# Patient Record
Sex: Female | Born: 1965 | Race: White | Hispanic: No | Marital: Married | Smoking: Former smoker
Health system: Southern US, Community
[De-identification: ages and names within clinical notes are randomized; demographics above are authoritative.]

## PROBLEM LIST (undated history)

## (undated) DIAGNOSIS — J302 Other seasonal allergic rhinitis: Secondary | ICD-10-CM

## (undated) DIAGNOSIS — E785 Hyperlipidemia, unspecified: Secondary | ICD-10-CM

## (undated) DIAGNOSIS — H832X9 Labyrinthine dysfunction, unspecified ear: Secondary | ICD-10-CM

## (undated) DIAGNOSIS — J45909 Unspecified asthma, uncomplicated: Secondary | ICD-10-CM

## (undated) HISTORY — DX: Other seasonal allergic rhinitis: J30.2

## (undated) HISTORY — PX: OTHER SURGICAL HISTORY: SHX169

## (undated) HISTORY — PX: TMJ ARTHROPLASTY: SHX1066

## (undated) HISTORY — DX: Unspecified asthma, uncomplicated: J45.909

## (undated) HISTORY — PX: HAND SURGERY: SHX662

## (undated) HISTORY — DX: Hyperlipidemia, unspecified: E78.5

## (undated) HISTORY — DX: Labyrinthine dysfunction, unspecified ear: H83.2X9

## (undated) HISTORY — PX: EXPLORATORY LAPAROTOMY: SUR591

---

## 2007-09-14 DIAGNOSIS — J4520 Mild intermittent asthma, uncomplicated: Secondary | ICD-10-CM

## 2007-09-14 HISTORY — DX: Mild intermittent asthma, uncomplicated: J45.20

## 2011-05-06 DIAGNOSIS — M5481 Occipital neuralgia: Secondary | ICD-10-CM | POA: Insufficient documentation

## 2011-05-06 HISTORY — DX: Occipital neuralgia: M54.81

## 2015-07-25 ENCOUNTER — Other Ambulatory Visit: Payer: Self-pay | Admitting: Family Medicine

## 2015-07-25 ENCOUNTER — Other Ambulatory Visit: Payer: Self-pay

## 2015-07-25 DIAGNOSIS — R42 Dizziness and giddiness: Secondary | ICD-10-CM

## 2015-07-25 DIAGNOSIS — Z1231 Encounter for screening mammogram for malignant neoplasm of breast: Secondary | ICD-10-CM

## 2015-08-01 ENCOUNTER — Ambulatory Visit
Admission: RE | Admit: 2015-08-01 | Discharge: 2015-08-01 | Disposition: A | Discharge status: Home / Self Care | Payer: Managed Care, Other (non HMO) | Source: Ambulatory Visit | Attending: Family Medicine | Admitting: Family Medicine

## 2015-08-01 DIAGNOSIS — R42 Dizziness and giddiness: Secondary | ICD-10-CM

## 2015-08-08 ENCOUNTER — Ambulatory Visit: Payer: Self-pay

## 2015-08-14 ENCOUNTER — Ambulatory Visit
Admission: RE | Admit: 2015-08-14 | Discharge: 2015-08-14 | Disposition: A | Discharge status: Home / Self Care | Payer: Managed Care, Other (non HMO) | Source: Ambulatory Visit

## 2015-08-14 DIAGNOSIS — Z1231 Encounter for screening mammogram for malignant neoplasm of breast: Secondary | ICD-10-CM

## 2015-10-20 ENCOUNTER — Encounter: Payer: Self-pay | Admitting: Neurology

## 2015-10-20 ENCOUNTER — Ambulatory Visit (INDEPENDENT_AMBULATORY_CARE_PROVIDER_SITE_OTHER): Payer: Managed Care, Other (non HMO) | Admitting: Neurology

## 2015-10-20 VITALS — BP 120/70 | HR 104 | Ht 62.0 in | Wt 131.0 lb

## 2015-10-20 DIAGNOSIS — G44219 Episodic tension-type headache, not intractable: Secondary | ICD-10-CM

## 2015-10-20 DIAGNOSIS — H832X3 Labyrinthine dysfunction, bilateral: Secondary | ICD-10-CM

## 2015-10-20 DIAGNOSIS — H832X9 Labyrinthine dysfunction, unspecified ear: Secondary | ICD-10-CM | POA: Insufficient documentation

## 2015-10-20 HISTORY — DX: Labyrinthine dysfunction, unspecified ear: H83.2X9

## 2015-10-20 HISTORY — DX: Episodic tension-type headache, not intractable: G44.219

## 2015-10-20 MED ORDER — NORTRIPTYLINE HCL 75 MG PO CAPS: 75.0000 mg | ORAL_CAPSULE | Freq: Every day | ORAL | 6 refills | Status: DC

## 2015-10-20 NOTE — Progress Notes (Signed)
NEUROLOGY CONSULTATION NOTE  Rhonda Harmon MRN: [id number] DOB: 12-Apr-1965  Referring provider: Dr. Sigmund Hazel Primary care provider: none  Reason for consult:  Arachnoid cyst  Dear Dr Hyacinth Meeker:  Thank you for your kind referral of Rhonda Harmon for consultation of the above symptoms. Although her history is well known to you, please allow me to reiterate it for the purpose of our medical record. Records and images were personally reviewed where available.  HISTORY OF PRESENT ILLNESS: This is a pleasant 50 year old right-handed woman with a history of headaches and vertigo presenting to establish care. Records from her previous neurologist in [place] were reviewed. She has been having dizziness since at least 2009, described as a constant unsteady sensation daily where she feels like she is getting off a cruise ship. No associated hearing loss or tinnitus. MRI brain with and without contrast done 2009 in [place] reported as normal. She had seen vestibular therapy in [place] and was diagnosed with bilateral peripheral vestibular dysfunction. She had significant improvement of symptoms with vestibular therapy. She started having occipital neuralgia in 2013 and was started on Nortriptyline. She reports this helps with both the headaches and dizziness. She states the occipital neuralgia is better, she has pressure-like headaches over the bilateral temporal regions occurring around once a week, no associated nausea, vomiting, photo/phonophobia, visual obscurations. She occasionally wakes up with a headache. She takes prn Excedrin with good effect. She is taking nortriptyline  qhs with no side effects, sleep is good. She denies any diplopia, dysarthria/dysphagia, focal numbness/tingling/weakness. She has some neck pain. No bowel/bladder dysfunction. Memory is good, but she sometimes has a hard time focusing due to her balance issues. She fell 6 months ago while walking her dog. She denies any  history of head injuries. Before she left [place], she reported worsening headaches and dizziness and had an MRI with [facility] doen 5/79/17 without contrast which I personally reviewed, there was an 8 x 15mm increased CSF signal with asymmetric enlargement of the right sylvian fissure superiorly which may represent an arachnoid cyst. There is a 3mm hyperintensity in the right parietal white matter. No acute changes. She presents for evaluation of abnormal MRI scan.  PAST MEDICAL HISTORY: Past Medical History:  Diagnosis Date  . Asthma   . Seasonal allergies   . Vestibular dysfunction     PAST SURGICAL HISTORY: Past Surgical History:  Procedure Laterality Date  . EXPLORATORY LAPAROTOMY    . HAND SURGERY    . TMJ ARTHROPLASTY      MEDICATIONS: No current outpatient prescriptions on file prior to visit.   No current facility-administered medications on file prior to visit.     ALLERGIES: Allergies  Allergen Reactions  . Other     Silk sutures   . Penicillins Rash    FAMILY HISTORY: Family History  Problem Relation Age of Onset  . Crohn's disease Sister   . Crohn's disease Sister     SOCIAL HISTORY: Social History   Social History  . Marital status: Married    Spouse name: N/A  . Number of children: N/A  . Years of education: N/A   Occupational History  . Not on file.   Social History Main Topics  . Smoking status: Former Smoker    Quit date: 10/19/2000  . Smokeless tobacco: Not on file  . Alcohol use No  . Drug use: No  . Sexual activity: NotRocky Rishelr Topics Concern  . Not on  file   Social History Narrative  . No narrative on file    REVIEW OF SYSTEMS: Constitutional: No fevers, chills, or sweats, no generalized fatigue, change in appetite Eyes: No visual changes, double vision, eye pain Ear, nose and throat: No hearing loss, ear pain, nasal congestion, sore throat Cardiovascular: No chest pain, palpitations Respiratory:  No shortness  of breath at rest or with exertion, wheezes GastrointestinaI: No nausea, vomiting, diarrhea, abdominal pain, fecal incontinence Genitourinary:  No dysuria, urinary retention or frequency Musculoskeletal:  No neck pain, back pain Integumentary: No rash, pruritus, skin lesions Neurological: as above Psychiatric: No depression, insomnia, anxiety Endocrine: No palpitations, fatigue, diaphoresis, mood swings, change in appetite, change in weight, increased thirst Hematologic/Lymphatic:  No anemia, purpura, petechiae. Allergic/Immunologic: no itchy/runny eyes, nasal congestion, recent allergic reactions, rashes  PHYSICAL EXAM: Vitals:   10/20/15 0844  BP: 120/70  Pulse: (!) 104   General: No acute distress Head:  Normocephalic/atraumatic Eyes: Fundoscopic exam shows bilateral sharp discs, no vessel changes, exudates, or hemorrhages Neck: supple, no paraspinal tenderness, full range of motion Back: No paraspinal tenderness Heart: regular rate and rhythm Lungs: Clear to auscultation bilaterally. Vascular: No carotid bruits. Skin/Extremities: No rash, no edema Neurological Exam: Mental status: alert and oriented to person, place, and time, no dysarthria or aphasia, Fund of knowledge is appropriate.  Recent and remote memory are intact. 2/3 delayed recall.  Attention and concentration are normal.    Able to name objects and repeat phrases. Cranial nerves: CN I: not tested CN II: pupils equal, round and reactive to light, visual fields intact, fundi unremarkable. CN III, IV, VI:  full range of motion, no nystagmus, no ptosis CN V: facial sensation intact CN VII: upper and lower face symmetric CN VIII: hearing intact to finger rub CN IX, X: gag intact, uvula midline CN XI: sternocleidomastoid and trapezius muscles intact CN XII: tongue midline Bulk & Tone: normal, no fasciculations. Motor: 5/5 throughout with no pronator drift. Sensation: intact to light touch, cold, pin, vibration and  joint position sense.  No extinction to double simultaneous stimulation.  Romberg test negative Deep Tendon Reflexes: +2 throughout, no ankle clonus Plantar responses: downgoing bilaterally Cerebellar: no incoordination on finger to nose, heel to shin. No dysdiadochokinesia Gait: narrow-based and steady, mild difficulty with tandem walk but able Tremor: none  IMPRESSION: This is a pleasant 50 year old right-handed woman with a history of occipital neuralgia and bilateral peripheral vestibular dysfunction with chronic sensation of dysequilibrium. She had an MRI brain scan done 08/01/15 which showed an 8x62mm arachnoid cyst. There was concern this may be associated with her headaches, however her description of current headaches are more suggestive of tension-type headaches. No further occipital component. We discussed arachnoid cysts and these typically being incidental findings. I do not see any other signs or symptoms that she is symptomatic from this. Would repeat interval MRI brain with and without contrast in a year, unless symptoms change. We discussed headaches, she will increase nortriptyline to 75mg  qhs. Side effects were discussed. With worsening dizziness, she will be referred for Vestibular therapy, which had significantly helped in the past. She will keep a headache diary and follow-up in 3 months.   Thank you for allowing me to participate in the care of this patient. Please do not hesitate to call for any questions or concerns.   Patrcia Dolly, M.D.  CC: Dr. Hyacinth Meeker

## 2015-10-20 NOTE — Progress Notes (Signed)
Note sent to Sigmund Hazel.

## 2015-10-20 NOTE — Patient Instructions (Addendum)
1. Increase nortriptyline to 75mg  at bedtime 2. Refer to Vestibular Therapy. You have been referred to Neuro Rehab. They will call you directly to schedule an appointment.  Please call [phone] if you do not hear from them.  3. Keep a calendar of your headaches 4. Follow-up in 3 months, call for any changes

## 2015-11-10 ENCOUNTER — Ambulatory Visit: Payer: Managed Care, Other (non HMO) | Attending: Neurology | Admitting: Physical Therapy

## 2015-11-10 ENCOUNTER — Encounter: Payer: Self-pay | Admitting: Physical Therapy

## 2015-11-10 DIAGNOSIS — R2689 Other abnormalities of gait and mobility: Secondary | ICD-10-CM | POA: Insufficient documentation

## 2015-11-10 DIAGNOSIS — R2681 Unsteadiness on feet: Secondary | ICD-10-CM | POA: Insufficient documentation

## 2015-11-10 NOTE — Patient Instructions (Signed)
  Gaze Stabilization: Tip Card 1.Target must remain in focus, not blurry, and appear stationary while head is in motion. 2.Perform exercises with small head movements (45 to either side of midline). 3.Increase speed of head motion so long as target is in focus. 4.If you wear eyeglasses, be sure you can see target through lens (therapist will give specific instructions for bifocal / progressive lenses). 5.These exercises may provoke dizziness or nausea. Work through these symptoms. If too dizzy, slow head movement slightly. Rest between each exercise. 6.Exercises demand concentration; avoid distractions. 7.For safety, perform standing exercises close to a counter, wall, corner, or next to someone.  Copyright  VHI. All rights reserved.  Gaze Stabilization: Standing Feet Apart   Feet shoulder width apart, keeping eyes on target on wall 3 feet away, tilt head down slightly and move head side to side for 30 seconds. Repeat while moving head up and down for 30 seconds. Do 2 sessions per day.   Copyright  VHI. All rights reserved.    

## 2015-11-10 NOTE — Therapy (Signed)
[facility] [address] [place], [place], [address] Phone: [phone]   Fax:  [phone]  Physical Therapy Evaluation  Patient Details  Name: Rhonda Harmon MRN: [id number] Date of Birth: Aug 05, 1965 Referring Provider: Patrcia DollyKaren Aquino, MD  Encounter Date: 11/10/2015      PT End of Session - 11/10/15 1303    Visit Number 1   Number of Visits 9  eval + 8 visits   Date for PT Re-Evaluation 12/25/15   Authorization Type [employer]   PT Start Time 1148   PT Stop Time 1238   PT Time Calculation (min) 50 min   Equipment Utilized During Treatment Other (comment)  Balance Master   Activity Tolerance Patient tolerated treatment well   Behavior During Therapy [facility] for tasks assessed/performed      Past Medical History:  Diagnosis Date  . Asthma   . Seasonal allergies   . Vestibular dysfunction     Past Surgical History:  Procedure Laterality Date  . EXPLORATORY LAPAROTOMY    . HAND SURGERY    . TMJ ARTHROPLASTY      There were no vitals filed for this visit.       Subjective Assessment - 11/10/15 1152    Subjective "I think I need a refresher on restabilizing myself. The PT in [place], where I lived. I feel like I fidget with my feet when I'm sitting down.Marland Kitchen.Marland Kitchen.I think I'm fidging because I'm off-balance." Per pt, prior PT noted vestibular dysequilibirum and treatment was very effective. Pt worked with vestibular PT in [place] 4 different times. Pt denies room-spinning dizziness. Pt sustains intermittent falls (once in the past 6 months), usually when descending stairs, sidewalk, or declined surface. Increased imbalance when walking over unlevel surfaces.   Pertinent History PMH significant for: arachnoid cyst, exploratory laparotomy, TMJ arthroplasty, asthma, h/o vestibular dysequilibirum (bilateral, per chart)   Patient Stated Goals "Just settling back down, to stop fidgeting to find that level ground."   Currently in  Pain? Yes   Pain Score 5    Pain Location Other (Comment)  B temporal   Pain Orientation Right;Left   Pain Descriptors / Indicators Headache   Pain Type Chronic pain   Pain Onset More than a month ago   Pain Frequency Intermittent   Aggravating Factors  dizziness, increased visual stimulation   Pain Relieving Factors nortriptyline   Multiple Pain Sites No            [facility] PT Assessment - 11/10/15 0001      Assessment   Medical Diagnosis Episodic tension-type headache, not intractable; Vestibular disequilibrium, bilateral   Referring Provider Patrcia DollyKaren Aquino, MD   Onset Date/Surgical Date 03/25/05   Prior Therapy Has had vestibular PT in past when pt lived in [place]     Precautions   Precautions Fall     Restrictions   Weight Bearing Restrictions No     Balance Screen   Has the patient fallen in the past 6 months Yes   How many times? 1   Has the patient had a decrease in activity level because of a fear of falling?  No   Is the patient reluctant to leave their home because of a fear of falling?  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     Cognition   Overall Cognitive Status Within Functional Limits for tasks assessed            Vestibular Assessment - 11/10/15 0001      Symptom Behavior   Type of Dizziness Imbalance   Frequency of Dizziness daily; constant   Duration of Dizziness varies; until whatever is provoking the dizziness stops   Aggravating Factors Turning body quickly;Turning head quickly;Comment  increased  visual stimulation   Relieving Factors Avoiding busy/distracting areas     Occulomotor Exam   Occulomotor Alignment Normal   Spontaneous Absent   Gaze-induced Absent   Smooth Pursuits Intact   Saccades Comment  hypometric inferior to midline   Comment L Head Thrust Test (+) but asymptomatic     Vestibulo-Occular Reflex   VOR 1 Head Only (x 1 viewing) Impaired; visual target blurring with horizontal head movement   Comment Convergence appears WNL.     Visual Acuity   Static Line 7   Dynamic Line 4  Abnormal     Positional Testing   Dix-Hallpike --  BPPV not assessed due to history not suggestive     Pension scheme managerBalancemaster   Balancemaster Sensory organization test   Balancemaster Comment Composite score = 73% compared to age/height normative value 69%. Findings suggest pt ability to use vestibular, somatosensory, and visual input for balance are WNL for age/height. Noted that 2 trials during Condition 5 were lower than average immediately after pt turned head horizontally to speak with this PT. COG alignment:  midline with posterior preference.                [facility] Adult PT Treatment/Exercise - 11/10/15 0001      Ambulation/Gait   Ambulation/Gait Yes   Ambulation/Gait Assistance 6: Modified independent (Device/Increase time);5: Supervision   Ambulation/Gait Assistance Details Mod I for linear gait over level, indoor surfaces. (S) for gait with horizontal head turns.   Ambulation Distance (Feet) 200 Feet   Assistive device None   Gait Pattern Step-through pattern;Narrow base of support   Ambulation Surface Level;Indoor         Vestibular Treatment/Exercise - 11/10/15 0001      Vestibular Treatment/Exercise   Gaze Exercises X1 Viewing Horizontal;X1 Viewing Vertical     X1 Viewing Horizontal   Foot Position standing; seated   Reps 2   Comments 2 x30 seconds. Initial trial in standing; however, pt demonstrated significant difficulty coordinating horizontal head turns  without turning body. Pt had decreased difficulty during subsequent trial in seated     X1 Viewing Vertical   Foot Position seated   Reps 1   Comments x30 seconds; cueing for technique               PT Education - 11/10/15 1250    Education provided Yes   Education Details PT evaluation findings, goals, and POC. SOT findings and implications. HEP initiated for x1 viewing.   Person(s) Educated Patient   Methods Explanation;Verbal cues;Demonstration;Handout   Comprehension Verbalized understanding;Returned demonstration          PT Short Term Goals - 11/10/15 1310      PT SHORT TERM GOAL #1   Title STG"s = LTG's  PT Long Term Goals - 11/10/15 1310      PT LONG TERM GOAL #1   Title Pt will independently perform vestibular HEP to maximize functional gains made in PT.  (Target: 12/08/15)     PT LONG TERM GOAL #2   Title Pt will decrease DHI score from 20 to < / = 2 to indicate significant decrease in pt-perceived disability due to dizziness.  (12/08/15)     PT LONG TERM GOAL #3   Title Complete FGA and write appropriate goal to assess/address dynamic gait stability.  (12/08/15)     PT LONG TERM GOAL #4   Title Dynamic visual acuity will be WNL to indicate improved VOR.  (12/08/15)     PT LONG TERM GOAL #5   Title Pt will independently ambulate >1,000' over unlevel paved and grass surfaces with no overt LOB to indicate improved stability with community mobility. (12/08/15)     Additional Long Term Goals   Additional Long Term Goals Yes     PT LONG TERM GOAL #6   Title Pt will independently negotiate 28 stairs with rails with no overt LOB to indicate increased safety accessing second-floor apartment.  (12/08/15)               Plan - 11/10/15 1305    Clinical Impression Statement Pt is a 50 y/o F referred to vestibular PT to address imbalance. PMH significant for: arachnoid cyst, exploratory laparotomy, TMJ arthroplasty, asthma, h/o vestibular  dysequilibirum (bilateral, per chart). PT evaluation reveals the following: impaired VOR, (+) L Head Thrust Test, abnormal dynamic visual acuity; and decreased gait stability with horizontal head turns. SOT findings suggest that pt ability to utilize multi-sensory input for balance is WNL for age/height. Pt will benefit from vestibular PT 2x/week for up to 4 weeks to address said impairments.    Rehab Potential Good   Clinical Impairments Affecting Rehab Potential Pt reports success with vestibular PT in past   PT Frequency 2x / week   PT Duration 4 weeks   PT Treatment/Interventions Gait training;ADLs/Self Care Home Management;Canalith Repostioning;Vestibular;Neuromuscular re-education;Balance training;Therapeutic exercise;Cognitive remediation;Manual techniques;Patient/family education;Functional mobility training;Stair training;Therapeutic activities   PT Next Visit Plan Assess FGA and write appropriate goal. Assess pt stability over unlevel surfaces and with descending ramps, stairs (as falls usually occur when going down stairs). Expand on HEP.   Consulted and Agree with Plan of Care Patient      Patient will benefit from skilled therapeutic intervention in order to improve the following deficits and impairments:  Abnormal gait, Dizziness, Decreased balance  Visit Diagnosis: Other abnormalities of gait and mobility - Plan: PT plan of care cert/re-cert  Unsteadiness on feet - Plan: PT plan of care cert/re-cert     Problem List Patient Active Problem List   Diagnosis Date Noted  . Episodic tension-type headache, not intractable 10/20/2015  . Vestibular disequilibrium 10/20/2015    Jorje Guild, PT, DPT [facility] [address] [place], [place], [address] Phone: [phone]   Fax:  [phone] 11/10/15, 6:11 PM  Name: Rhonda Harmon MRN: [id number] Date of Birth: 1966-03-01

## 2015-11-13 ENCOUNTER — Ambulatory Visit: Payer: Managed Care, Other (non HMO) | Admitting: Rehabilitative and Restorative Service Providers"

## 2015-11-13 DIAGNOSIS — R2681 Unsteadiness on feet: Secondary | ICD-10-CM

## 2015-11-13 DIAGNOSIS — R2689 Other abnormalities of gait and mobility: Secondary | ICD-10-CM

## 2015-11-13 NOTE — Therapy (Signed)
[facility] [address] [place], [place], [address] Phone: [phone]   Fax:  [phone]  Physical Therapy Treatment  Patient Details  Name: Rhonda Harmon MRN: [id number] Date of Birth: 1966-03-05 Referring Provider: Ellouise Newer, MD  Encounter Date: 11/13/2015      PT End of Session - 11/13/15 1942    Visit Number 2   Number of Visits 9  eval + 8 visits   Date for PT Re-Evaluation 12/25/15   Authorization Type [employer]   PT Start Time 1020   PT Stop Time 1100   PT Time Calculation (min) 40 min   Activity Tolerance Patient tolerated treatment well   Behavior During Therapy [facility] for tasks assessed/performed      Past Medical History:  Diagnosis Date  . Asthma   . Seasonal allergies   . Vestibular dysfunction     Past Surgical History:  Procedure Laterality Date  . EXPLORATORY LAPAROTOMY    . HAND SURGERY    . TMJ ARTHROPLASTY      There were no vitals filed for this visit.      Subjective Assessment - 11/13/15 1022    Subjective The patient feels that her most challenging activity is scanning between 3 different computer screens and having to look to right for door and left for window.   Currently in Pain? No/denies  no Headache present today.            [facility] PT Assessment - 11/13/15 1024      Ambulation/Gait   Stairs Yes   Stairs Assistance 7: Independent   Stair Management Technique No rails;Alternating pattern   Number of Stairs 8     Functional Gait  Assessment   Gait assessed  Yes   Gait Level Surface Walks 20 ft in less than 5.5 sec, no assistive devices, good speed, no evidence for imbalance, normal gait pattern, deviates no more than 6 in outside of the 12 in walkway width.   Change in Gait Speed Able to smoothly change walking speed without loss of balance or gait deviation. Deviate no more than 6 in outside of the 12 in walkway width.   Gait with Horizontal Head Turns  Performs head turns smoothly with slight change in gait velocity (eg, minor disruption to smooth gait path), deviates 6-10 in outside 12 in walkway width, or uses an assistive device.   Gait with Vertical Head Turns Performs head turns with no change in gait. Deviates no more than 6 in outside 12 in walkway width.   Gait and Pivot Turn Pivot turns safely within 3 sec and stops quickly with no loss of balance.   Step Over Obstacle Is able to step over one shoe box (4.5 in total height) but must slow down and adjust steps to clear box safely. May require verbal cueing.  adjusts steps to negotiate   Gait with Narrow Base of Support Ambulates 4-7 steps.   Gait with Eyes Closed Walks 20 ft, uses assistive device, slower speed, mild gait deviations, deviates 6-10 in outside 12 in walkway width. Ambulates 20 ft in less than 9 sec but greater than 7 sec.   Ambulating Backwards Walks 20 ft, no assistive devices, good speed, no evidence for imbalance, normal gait   Steps Alternating feet, no rail.   Total Score 24   FGA comment: 24/30             Vestibular Assessment - 11/13/15 1031  Vestibular Assessment   General Observation Patient reports that she feels a sensation of leaving a cruise ship/ a rocking sensation that she notes is worse when sitting at her desk.  She also reports ankle fatigue from catching herself throughout the day (even when seated).  She wears high heels often and feels her balance is improved in heels vs flats.                  [facility] PT Treatment/Exercise - 11/13/15 1100      Neuro Re-ed    Neuro Re-ed Details  Standing on a pillow with feet apart> feet together to narrow base of support, performing eyes closed; marching with eyes closed.  Dynamic standing activities including walking with head motion and turns during gait.           Vestibular Treatment/Exercise - 11/13/15 1100      Vestibular Treatment/Exercise   Vestibular Treatment Provided  Habituation;Gaze   Habituation Exercises Seated Horizontal Head Turns;Standing Horizontal Head Turns   Gaze Exercises X1 Viewing Horizontal;X1 Viewing Vertical     Seated Horizontal Head Turns   Number of Reps  10   Symptom Description  seated on stationery physioball      Standing Horizontal Head Turns   Number of Reps  10   Symptom Description  on compliant surface with feet together for narrow base of support.     X1 Viewing Horizontal   Foot Position standing feet apart   Comments performed x 30 seconds with cues to maintain shoulders stationery and maintain equal ROM to R And L sides.      X1 Viewing Vertical   Foot Position standing feet apart   Comments performed x 30 seconds.               PT Education - 11/13/15 1941    Education provided Yes   Education Details HEP: gaze x 1 viewing reviewed, corner balance (feet apart + eyes closed and feet together head motion), tandem gait, horizontal head turns in sitting on physioball   Person(s) Educated Patient   Methods Explanation;Demonstration;Handout   Comprehension Verbalized understanding;Returned demonstration          PT Short Term Goals - 11/10/15 1310      PT SHORT TERM GOAL #1   Title STG"s = LTG's           PT Long Term Goals - 11/13/15 1943      PT LONG TERM GOAL #1   Title Pt will independently perform vestibular HEP to maximize functional gains made in PT.  (Target: 12/08/15)   Status On-going     PT LONG TERM GOAL #2   Title Pt will decrease DHI score from 20 to < / = 2 to indicate significant decrease in pt-perceived disability due to dizziness.  (12/08/15)   Status On-going     PT LONG TERM GOAL #3   Title Complete FGA and write appropriate goal to assess/address dynamic gait stability.  (12/08/15)   Baseline Met on 11/13/2015- see updated goal below.   Status Achieved     PT LONG TERM GOAL #4   Title Dynamic visual acuity will be WNL to indicate improved VOR.  (12/08/15)   Status  On-going     PT LONG TERM GOAL #5   Title Pt will independently ambulate >1,000' over unlevel paved and grass surfaces with no overt LOB to indicate improved stability with community mobility. (12/08/15)   Status On-going  Additional Long Term Goals   Additional Long Term Goals Yes     PT LONG TERM GOAL #6   Title Pt will independently negotiate 28 stairs with rails with no overt LOB to indicate increased safety accessing second-floor apartment.  (12/08/15)   Status On-going     PT LONG TERM GOAL #7   Title The patient will improve FGA from 24/30 to > or equal to 28/30 to demonstrate improved dynamic balance for gait activities.   Baseline Target date 12/08/2015   Status New               Plan - 11/13/15 1949    Clinical Impression Statement The patient tolerated treatment well today increasing activities to include high level balance tasks, sensory training, and task specific training to improve performance in work environment.  PT to progress activities to patient tolerance.     Rehab Potential Good   PT Treatment/Interventions Gait training;ADLs/Self Care Home Management;Canalith Repostioning;Vestibular;Neuromuscular re-education;Balance training;Therapeutic exercise;Cognitive remediation;Manual techniques;Patient/family education;Functional mobility training;Stair training;Therapeutic activities   PT Next Visit Plan Assess unlevel surface negotiation/outdoor gait activities.  Check HEP and  modify as indicated.   Consulted and Agree with Plan of Care Patient      Patient will benefit from skilled therapeutic intervention in order to improve the following deficits and impairments:  Abnormal gait, Dizziness, Decreased balance  Visit Diagnosis: Other abnormalities of gait and mobility  Unsteadiness on feet     Problem List Patient Active Problem List   Diagnosis Date Noted  . Episodic tension-type headache, not intractable 10/20/2015  . Vestibular disequilibrium  10/20/2015    Jaivyn Gulla, PT 11/13/2015, 7:51 PM  [facility] [address] [place], [place], [address] Phone: [phone]   Fax:  [phone]  Name: Rhonda Harmon MRN: [id number] Date of Birth: 05-28-1965

## 2015-11-13 NOTE — Patient Instructions (Signed)
Gaze Stabilization: Tip Card 1.Target must remain in focus, not blurry, and appear stationary while head is in motion. 2.Perform exercises with small head movements (45 to either side of midline). 3.Increase speed of head motion so long as target is in focus. 4.If you wear eyeglasses, be sure you can see target through lens (therapist will give specific instructions for bifocal / progressive lenses). 5.These exercises may provoke dizziness or nausea. Work through these symptoms. If too dizzy, slow head movement slightly. Rest between each exercise. 6.Exercises demand concentration; avoid distractions. 7.For safety, perform standing exercises close to a counter, wall, corner, or next to someone.  Copyright  VHI. All rights reserved.  Gaze Stabilization: Standing Feet Apart   Feet shoulder width apart, keeping eyes on target on wall 3 feet away, tilt head down slightly and move head side to side for 30 seconds. Repeat while moving head up and down for 30 seconds. Do 2 sessions per day.  Copyright  VHI. All rights reserved.     Feet Apart (Compliant Surface) Varied Arm Positions - Eyes Closed    Stand on compliant surface: __pillow______ with feet shoulder width apart and arms out. Close eyes and visualize upright position. Hold__30__ seconds. Repeat __3__ times per session. Do 1-2___ sessions per day. CAN MOVE TO FEET TOGETHER AS THIS GETS EASIER.  Copyright  VHI. All rights reserved.   Feet Together (Compliant Surface) Head Motion - Eyes Open    With eyes open, standing on compliant surface: __pillow ______, feet together, move head slowly: side to side.  Repeat __10__ times per session. Do _1-2___ sessions per day.  Copyright  VHI. All rights reserved.   Tandem Walking    Walk with each foot directly in front of other, heel of one foot touching toes of other foot with each step. Both feet straight ahead. Near a counter top or wall.   Copyright  VHI. All rights  reserved.   Neck Roll    Sitting on balance ball near support surface (wall or desk), turn head side to side x 10 repetitions while holding balance.   Rest, repeat 10 more head turns.  1-2 times/day.   Copyright  VHI. All rights reserved.

## 2015-11-28 ENCOUNTER — Ambulatory Visit: Payer: Managed Care, Other (non HMO) | Admitting: Rehabilitative and Restorative Service Providers"

## 2015-12-01 ENCOUNTER — Ambulatory Visit: Payer: Managed Care, Other (non HMO) | Admitting: Rehabilitative and Restorative Service Providers"

## 2015-12-05 ENCOUNTER — Ambulatory Visit
Payer: Managed Care, Other (non HMO) | Attending: Neurology | Admitting: Rehabilitative and Restorative Service Providers"

## 2015-12-05 DIAGNOSIS — R2681 Unsteadiness on feet: Secondary | ICD-10-CM | POA: Diagnosis present

## 2015-12-05 DIAGNOSIS — R2689 Other abnormalities of gait and mobility: Secondary | ICD-10-CM | POA: Diagnosis present

## 2015-12-05 NOTE — Therapy (Signed)
[facility] [address] [place], [place], [address] Phone: [phone]   Fax:  [phone]  Physical Therapy Treatment  Patient Details  Name: Rhonda Harmon MRN: [id number] Date of Birth: September 02, 1965 Referring Provider: Ellouise Newer, MD  Encounter Date: 12/05/2015      PT End of Session - 12/05/15 0943    Visit Number 3   Number of Visits 9  eval + 8 visits   Date for PT Re-Evaluation 12/25/15   Authorization Type [employer]et[employer]   PT Start Time [phone]   PT Stop Time 1005   PT Time Calculation (min) 31 min   Activity Tolerance Patient tolerated treatment well   Behavior During Therapy [facility] for tasks assessed/performed      Past Medical History:  Diagnosis Date  . Asthma   . Seasonal allergies   . Vestibular dysfunction     Past Surgical History:  Procedure Laterality Date  . EXPLORATORY LAPAROTOMY    . HAND SURGERY    . TMJ ARTHROPLASTY      There were no vitals filed for this visit.      Subjective Assessment - 12/05/15 0933    Subjective The patient has not been able to do HEP as regularly due to work schedule.    Patient Stated Goals "Just settling back down, to stop fidgeting to find that level ground."   Currently in Pain? No/denies                         [facility] Adult PT Treatment/Exercise - 12/05/15 1012      Ambulation/Gait   Ambulation/Gait Yes   Gait Comments dynamic gait activities performing card reading, ball tos, head motion (as described in vestibular treatment)     Neuro Re-ed    Neuro Re-ed Details  Standing on rocker board with head motion horizontal and vertical planes; ball toss, ball pass for horizontal movement.         Vestibular Treatment/Exercise - 12/05/15 0958      Vestibular Treatment/Exercise   Vestibular Treatment Provided Gaze;Habituation   Habituation Exercises Standing Horizontal Head Turns;Standing Vertical Head Turns;Standing Diagonal Head Turns    Gaze Exercises X1 Viewing Horizontal;X2 Viewing Horizontal;X1 Viewing Vertical;X2 Viewing Vertical     Standing Horizontal Head Turns   Number of Reps  10   Symptom Description  Performed horizontal head motion today during dynamic standing tasks of gait activities, marching and while reading cards to avoid eyes looking to ground while head moves     Standing Vertical Head Turns   Number of Reps  10   Symptom Description  Performed multiple reps of head motion with gait, marching and during ball toss with walking     Standing Diagonal Head Turns   Number of Reps  10   Symptiom Description  Performed multiple repetitions performing up to the R and down to the L and reverse during walking, marching.     X1 Viewing Horizontal   Foot Position standing feet apart   Comments performed as review before beginning x 2 viewing     X1 Viewing Vertical   Foot Position standing feet apart   Comments performed as review prior to beginning x 2 viewing     X2 Viewing Horizontal   Foot Position standing feet apart    Comments needed tactile cues for coordination x 10 reps, rest and repeat     X2 Viewing Vertical   Foot Position standing feet  apart   Comments needed tactile cues for coordination of task.               PT Education - 12/05/15 1007    Education provided Yes   Education Details Emphasized need for continued HEP and taking 10 minutes/day to determine if this helps.   Discussed compensatory strategies to use in busy environments.   Person(s) Educated Patient   Methods Explanation   Comprehension Verbalized understanding          PT Short Term Goals - 11/10/15 1310      PT SHORT TERM GOAL #1   Title STG"s = LTG's           PT Long Term Goals - 12/05/15 1006      PT LONG TERM GOAL #1   Title Pt will independently perform vestibular HEP to maximize functional gains made in PT.  (Target: 12/08/15)   Status On-going     PT LONG TERM GOAL #2   Title Pt will  decrease DHI score from 20 to < / = 2 to indicate significant decrease in pt-perceived disability due to dizziness.  (12/08/15)   Status On-going     PT LONG TERM GOAL #3   Title Complete FGA and write appropriate goal to assess/address dynamic gait stability.  (12/08/15)   Baseline Met on 11/13/2015- see updated goal below.   Status Achieved     PT LONG TERM GOAL #4   Title Dynamic visual acuity will be WNL to indicate improved VOR.  (12/08/15)   Status On-going     PT LONG TERM GOAL #5   Title Pt will independently ambulate >1,000' over unlevel paved and grass surfaces with no overt LOB to indicate improved stability with community mobility. (12/08/15)   Status On-going     PT LONG TERM GOAL #6   Title Pt will independently negotiate 28 stairs with rails with no overt LOB to indicate increased safety accessing second-floor apartment.  (12/08/15)   Baseline Met on 12/05/2015   Status Achieved     PT LONG TERM GOAL #7   Title The patient will improve FGA from 24/30 to > or equal to 28/30 to demonstrate improved dynamic balance for gait activities.   Baseline Target date 12/08/2015   Status On-going               Plan - 12/05/15 1013    Clinical Impression Statement The patient relies heavily on vision for balance and appears most impacted in busy environments (work and while driving in darkness/rain, lights, etc).  PT recommended she focus on getting HEP completed in order to determine if intervention will help with mgmt of symptoms.  PT to f/u in 3 days after doing HEP and plans to progress as able.    PT Treatment/Interventions Gait training;ADLs/Self Care Home Management;Canalith Repostioning;Vestibular;Neuromuscular re-education;Balance training;Therapeutic exercise;Cognitive remediation;Manual techniques;Patient/family education;Functional mobility training;Stair training;Therapeutic activities   PT Next Visit Plan Assess unlevel surface negotiation/outdoor gait activities.  Check  HEP and  modify as indicated.   Consulted and Agree with Plan of Care Patient      Patient will benefit from skilled therapeutic intervention in order to improve the following deficits and impairments:  Abnormal gait, Dizziness, Decreased balance  Visit Diagnosis: Other abnormalities of gait and mobility  Unsteadiness on feet     Problem List Patient Active Problem List   Diagnosis Date Noted  . Episodic tension-type headache, not intractable 10/20/2015  . Vestibular disequilibrium 10/20/2015  [place], PT 12/05/2015, 1:30 PM  [facility] [address] [place], [place], [address] Phone: [phone]   Fax:  [phone]  Name: Rhonda Harmon MRN: [id number] Date of Birth: 20-Nov-1965

## 2015-12-08 ENCOUNTER — Ambulatory Visit: Payer: Managed Care, Other (non HMO) | Admitting: Rehabilitative and Restorative Service Providers"

## 2015-12-08 DIAGNOSIS — R2681 Unsteadiness on feet: Secondary | ICD-10-CM

## 2015-12-08 DIAGNOSIS — R2689 Other abnormalities of gait and mobility: Secondary | ICD-10-CM

## 2015-12-08 NOTE — Patient Instructions (Signed)
Gaze Stabilization: Tip Card 1.Target must remain in focus, not blurry, and appear stationary while head is in motion. 2.Perform exercises with small head movements (45 to either side of midline). 3.Increase speed of head motion so long as target is in focus. 4.If you wear eyeglasses, be sure you can see target through lens (therapist will give specific instructions for bifocal / progressive lenses). 5.These exercises may provoke dizziness or nausea. Work through these symptoms. If too dizzy, slow head movement slightly. Rest between each exercise. 6.Exercises demand concentration; avoid distractions. 7.For safety, perform standing exercises close to a counter, wall, corner, or next to someone.  Copyright  VHI. All rights reserved. Gaze Stabilization: Standing Feet Apart   Feet shoulder width apart, keeping eyes on target on wall 3 feet away, tilt head down slightly and move head side to side for 30 seconds. Repeat while moving head up and down for 30 seconds. Do 2 sessions per day.  Copyright  VHI. All rights reserved.  Visuo-Vestibular: Head / Eyes Moving in Opposite Direction    Holding a target, keep eyes on target and slowly move target side to side while moving head in OPPOSITE direction of target for __3-4__ seconds. Perform standing. Repeat __10__ times per session. Do _2___ sessions per day.  Copyright  VHI. All rights reserved.    Feet Apart (Compliant Surface) Varied Arm Positions - Eyes Closed    Stand on compliant surface: __pillow______ with feet shoulder width apart and arms out. Close eyes and visualize upright position. Hold__30__ seconds. Repeat __3__ times per session. Do 1-2___ sessions per day. CAN MOVE TO FEET TOGETHER AS THIS GETS EASIER.  Copyright  VHI. All rights reserved.   Feet Together (Compliant Surface) Head Motion - Eyes Open    With eyes open, standing on compliant surface: __pillow ______, feet together, move head slowly: side to  side.  Repeat __10__ times per session. Do _1-2___ sessions per day.  Copyright  VHI. All rights reserved.   Tandem Walking    Walk with each foot directly in front of other, heel of one foot touching toes of other foot with each step. Both feet straight ahead. Near a counter top or wall.   Copyright  VHI. All rights reserved.   Neck Roll    Sitting on balance ball near support surface (wall or desk), turn head side to side x 10 repetitions while holding balance.   Rest, repeat 10 more head turns.  1-2 times/day.   Copyright  VHI. All rights reserved.    Anabelle Bungert, PT8/21/2017 7:52 PM Signed [facility] [address] [place], [place], [address] Phone: [phone]   Fax:  [phone]

## 2015-12-08 NOTE — Therapy (Signed)
[facility] [place] [address] [place], [place], [address] Phone: [phone]   Fax:  [phone]  Physical Therapy Treatment  Patient Details  Name: Rhonda Harmon MRN: [id number] Date of Birth: [birth year] Referring Provider: Ellouise Newer, MD  Encounter Date: 12/08/2015      PT End of Session - 12/08/15 1524    Visit Number 4   Number of Visits 9  eval + 8 visits   Date for PT Re-Evaluation 12/25/15   Authorization Type [employer]   PT Start Time 1445   PT Stop Time 1524   PT Time Calculation (min) 39 min   Activity Tolerance Patient tolerated treatment well   Behavior During Therapy [facility] for tasks assessed/performed      Past Medical History:  Diagnosis Date  . Asthma   . Seasonal allergies   . Vestibular dysfunction     Past Surgical History:  Procedure Laterality Date  . EXPLORATORY LAPAROTOMY    . HAND SURGERY    . TMJ ARTHROPLASTY      There were no vitals filed for this visit.      Subjective Assessment - 12/08/15 1449    Subjective The patient reports that she is working on using less visual cues during the day and feels that helps.  She is doing HEP regularly this week.    Patient Stated Goals "Just settling back down, to stop fidgeting to find that level ground."   Currently in Pain? No/denies                         [facility] Adult PT Treatment/Exercise - 12/08/15 1526      Ambulation/Gait   Ambulation/Gait Yes   Ambulation/Gait Assistance 7: Independent   Ambulation Distance (Feet) 1000 Feet   Assistive device None   Gait Comments The patient negotiated outdoor surfaces on paved inclines, pinestraw, deferred grass due to patient's shoewear.   The patient did not have loss of balance.      Self-Care   Self-Care Other Self-Care Comments   Other Self-Care Comments  The patient and PT discussed visual reliance and focusing/emphasizing on utilizing vestibular cues to improve  balance.   Discussed negotiating/navigating busy environments and using visual fixation at near target,      Neuro Re-ed    Neuro Re-ed Details  x 1 viewing standing, x 1 viewing seated on physioball, and standing with busy background; x 2 viewing in standing in horizontal and vertical plane.  Reviewed tandem stance/gait, compliant surfaces with head motion, compliant surfaces with eyes closed.                  PT Education - 12/08/15 1523    Education provided Yes   Education Details Added gaze x 2 viewing   Person(s) Educated Patient   Methods Explanation   Comprehension Verbalized understanding          PT Short Term Goals - 11/10/15 1310      PT SHORT TERM GOAL #1   Title STG"s = LTG's           PT Long Term Goals - 12/08/15 1525      PT LONG TERM GOAL #1   Title Pt will independently perform vestibular HEP to maximize functional gains made in PT.  (Target: 12/08/15)   Baseline Met on 12/08/2015   Status Achieved     PT LONG TERM GOAL #2   Title Pt will decrease DHI score  from 20 to < / = 2 to indicate significant decrease in pt-perceived disability due to dizziness.  (12/08/15)   Status On-going     PT LONG TERM GOAL #3   Title Complete FGA and write appropriate goal to assess/address dynamic gait stability.  (12/08/15)   Baseline Met on 11/13/2015- see updated goal below.   Status Achieved     PT LONG TERM GOAL #4   Title Dynamic visual acuity will be WNL to indicate improved VOR.  (12/08/15)   Status On-going     PT LONG TERM GOAL #5   Title Pt will independently ambulate >1,000' over unlevel paved and grass surfaces with no overt LOB to indicate improved stability with community mobility. (12/08/15)   Baseline Patient slows speed on pine straw, but no loss of balance.   Status Achieved     PT LONG TERM GOAL #6   Title Pt will independently negotiate 28 stairs with rails with no overt LOB to indicate increased safety accessing second-floor apartment.   (12/08/15)   Baseline Met on 12/05/2015   Status Achieved     PT LONG TERM GOAL #7   Title The patient will improve FGA from 24/30 to > or equal to 28/30 to demonstrate improved dynamic balance for gait activities.   Baseline Target date 12/08/2015   Status On-going               Plan - 12/08/15 1656    Clinical Impression Statement The patient has met 4 LTGs.  PT to continue progressing towards remaining LTGs with patient progressing HEP.    PT Treatment/Interventions Gait training;ADLs/Self Care Home Management;Canalith Repostioning;Vestibular;Neuromuscular re-education;Balance training;Therapeutic exercise;Cognitive remediation;Manual techniques;Patient/family education;Functional mobility training;Stair training;Therapeutic activities   PT Next Visit Plan Check HEP, check LTGs, d/c with ongoing HEP   Consulted and Agree with Plan of Care Patient      Patient will benefit from skilled therapeutic intervention in order to improve the following deficits and impairments:  Abnormal gait, Dizziness, Decreased balance  Visit Diagnosis: Other abnormalities of gait and mobility  Unsteadiness on feet     Problem List Patient Active Problem List   Diagnosis Date Noted  . Episodic tension-type headache, not intractable 10/20/2015  . Vestibular disequilibrium 10/20/2015    Rhonda Harmon, PT 12/08/2015, 5:01 PM  [facility] [address] [place], [place], [address] Phone: [phone]   Fax:  [phone]  Name: Rhonda Harmon MRN: [id number] Date of Birth: 09-14-1965

## 2015-12-12 ENCOUNTER — Encounter: Payer: Managed Care, Other (non HMO) | Admitting: Physical Therapy

## 2015-12-15 ENCOUNTER — Encounter: Payer: Managed Care, Other (non HMO) | Admitting: Physical Therapy

## 2015-12-18 ENCOUNTER — Encounter: Payer: Managed Care, Other (non HMO) | Admitting: Physical Therapy

## 2015-12-20 ENCOUNTER — Ambulatory Visit: Payer: Managed Care, Other (non HMO) | Admitting: Rehabilitative and Restorative Service Providers"

## 2015-12-22 ENCOUNTER — Encounter: Payer: Managed Care, Other (non HMO) | Admitting: Physical Therapy

## 2016-01-04 ENCOUNTER — Ambulatory Visit
Payer: Managed Care, Other (non HMO) | Attending: Neurology | Admitting: Rehabilitative and Restorative Service Providers"

## 2016-02-05 ENCOUNTER — Ambulatory Visit (INDEPENDENT_AMBULATORY_CARE_PROVIDER_SITE_OTHER): Payer: Managed Care, Other (non HMO) | Admitting: Neurology

## 2016-02-05 ENCOUNTER — Encounter: Payer: Self-pay | Admitting: Neurology

## 2016-02-05 VITALS — BP 122/76 | HR 100 | Ht 62.0 in | Wt 137.3 lb

## 2016-02-05 DIAGNOSIS — G44219 Episodic tension-type headache, not intractable: Secondary | ICD-10-CM

## 2016-02-05 DIAGNOSIS — G930 Cerebral cysts: Secondary | ICD-10-CM

## 2016-02-05 DIAGNOSIS — H832X3 Labyrinthine dysfunction, bilateral: Secondary | ICD-10-CM | POA: Diagnosis not present

## 2016-02-05 HISTORY — DX: Cerebral cysts: G93.0

## 2016-02-05 MED ORDER — NORTRIPTYLINE HCL 50 MG PO CAPS: 50.0000 mg | ORAL_CAPSULE | Freq: Every day | ORAL | 3 refills | Status: DC

## 2016-02-05 NOTE — Patient Instructions (Signed)
1. Reduce nortriptyline to 50mg  at bedtime 2. Continue home vestibular exercises 3. Schedule repeat MRI brain with and without contrast for May 2018 4. Follow-up after MRI brain, call for any changes

## 2016-02-05 NOTE — Progress Notes (Signed)
NEUROLOGY FOLLOW UP OFFICE NOTE  Rhonda Harmon [id number]  HISTORY OF PRESENT ILLNESS: I had the pleasure of seeing Rhonda Harmon in follow-up in the neurology clinic on 02/05/2016.  The patient was last seen 4 months ago for headaches and vertigo. Headaches suggestive of tension-type headaches, nortriptyline dose was increased to 75mg  qhs. She was referred for vestibular therapy for the vertigo which she reports was amazing. She continues to do home exercise program. She would like to go back to 50mg  qhs dose of nortriptyline, she feels the headaches are better after she had her lenses changed. She denies any neck/back pain, focal numbness/tingling/weakness, no falls.   HPI 10/20/2015: This is a pleasant 50 yo [name] with a history of headaches and vertigo. Records from her previous neurologist in [place] were reviewed. She has been having dizziness since at least 2009, described as a constant unsteady sensation daily where she feels like she is getting off a cruise ship. No associated hearing loss or tinnitus. MRI brain with and without contrast done 2009 in [place] reported as normal. She had seen vestibular therapy in [place] and was diagnosed with bilateral peripheral vestibular dysfunction. She had significant improvement of symptoms with vestibular therapy. She started having occipital neuralgia in 2013 and was started on Nortriptyline. She reports this helps with both the headaches and dizziness. She states the occipital neuralgia is better, she has pressure-like headaches over the bilateral temporal regions occurring around once a week, no associated nausea, vomiting, photo/phonophobia, visual obscurations. She occasionally wakes up with a headache. She takes prn Excedrin with good effect. She is taking nortriptyline 50mg  qhs with no side effects, sleep is good. She denies any diplopia, dysarthria/dysphagia, focal numbness/tingling/weakness. She has some neck pain. No bowel/bladder dysfunction.  Memory is good, but she sometimes has a hard time focusing due to her balance issues. She fell 6 months ago while walking her dog. She denies any history of head injuries. Before she left [place], she reported worsening headaches and dizziness and had an MRI with [facility] done 5/79/17 without contrast which I personally reviewed, there was an 8 x 15mm increased CSF signal with asymmetric enlargement of the right sylvian fissure superiorly which may represent an arachnoid cyst. There is a 3mm hyperintensity in the right parietal white matter. No acute changes.   PAST MEDICAL HISTORY: Past Medical History:  Diagnosis Date  . Asthma   . Seasonal allergies   . Vestibular dysfunction     MEDICATIONS: Current Outpatient Prescriptions on File Prior to Visit  Medication Sig Dispense Refill  . diazepam (VALIUM) 2 MG tablet Take by mouth.    . montelukast (SINGULAIR) 10 MG tablet Take 10 mg by mouth at bedtime.    . nortriptyline (PAMELOR) 75 MG capsule Take 1 capsule (75 mg total) by mouth at bedtime. 30 capsule 6   No current facility-administered medications on file prior to visit.     ALLERGIES: Allergies  Allergen Reactions  . Other     Silk sutures   . Penicillins Rash    FAMILY HISTORY: Family History  Problem Relation Age of Onset  . Crohn's disease Sister   . Crohn's disease Sister     SOCIAL HISTORY: Social History   Social History  . Marital status: Married    Spouse name: N/A  . Number of children: N/A  . Years of education: N/A   Occupational History  . Not on file.   Social History Main Topics  . Smoking status: Former  Smoker    Quit date: 10/19/2000  . Smokeless tobacco: Never Used  . Alcohol use No  . Drug use: No  . Sexual activity: Not on file   Other Topics Concern  . Not on file   Social History Narrative  . No narrative on file    REVIEW OF SYSTEMS: Constitutional: No fevers, chills, or sweats, no generalized fatigue, change in  appetite Eyes: No visual changes, double vision, eye pain Ear, nose and throat: No hearing loss, ear pain, nasal congestion, sore throat Cardiovascular: No chest pain, palpitations Respiratory:  No shortness of breath at rest or with exertion, wheezes GastrointestinaI: No nausea, vomiting, diarrhea, abdominal pain, fecal incontinence Genitourinary:  No dysuria, urinary retention or frequency Musculoskeletal:  No neck pain, back pain Integumentary: No rash, pruritus, skin lesions Neurological: as above Psychiatric: No depression, insomnia, anxiety Endocrine: No palpitations, fatigue, diaphoresis, mood swings, change in appetite, change in weight, increased thirst Hematologic/Lymphatic:  No anemia, purpura, petechiae. Allergic/Immunologic: no itchy/runny eyes, nasal congestion, recent allergic reactions, rashes  PHYSICAL EXAM: Vitals:   02/05/16 0834  BP: 122/76  Pulse: 100   General: No acute distress Head:  Normocephalic/atraumatic Neck: supple, no paraspinal tenderness, full range of motion Heart:  Regular rate and rhythm Lungs:  Clear to auscultation bilaterally Back: No paraspinal tenderness Skin/Extremities: No rash, no edema Neurological Exam: alert and oriented to person, place, and time. No aphasia or dysarthria. Fund of knowledge is appropriate.  Recent and remote memory are intact.  Attention and concentration are normal.    Able to name objects and repeat phrases. Cranial nerves: Pupils equal, round, reactive to light. Extraocular movements intact with no nystagmus. Visual fields full. Facial sensation intact. No facial asymmetry. Tongue, uvula, palate midline.  Motor: Bulk and tone normal, muscle strength 5/5 throughout with no pronator drift.  Sensation to light touch intact.  No extinction to double simultaneous stimulation.  Deep tendon reflexes 2+ throughout, toes downgoing.  Finger to nose testing intact.  Gait narrow-based and steady, able to tandem walk adequately.   Romberg negative.  IMPRESSION: This is a pleasant 50 yo [name] with a history of occipital neuralgia and bilateral peripheral vestibular dysfunction with chronic sensation of dysequilibrium. 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 There was concern this may be associated with her headaches, however her description of current headaches are more suggestive of tension-type headaches. Headaches are better, she would like to reduce nortriptyline dose back to 50mg  qhs. Vestibular therapy helped significantly with dizziness, continue HEP. A repeat MRI brain with and without contrast will be ordered for May 2018, she will follow-up after MRI and knows to call for any changes.   Thank you for allowing me to participate in her care.  Please do not hesitate to call for any questions or concerns.  The duration of this appointment visit was 15 minutes of face-to-face time with the patient.  Greater than 50% of this time was spent in counseling, explanation of diagnosis, planning of further management, and coordination of care.   Patrcia DollyKaren Honesty Menta, M.D.

## 2016-03-06 ENCOUNTER — Encounter: Payer: Self-pay | Admitting: Rehabilitative and Restorative Service Providers"

## 2016-03-06 NOTE — Therapy (Signed)
[facility] [address] [place], [place], [address] Phone: [phone]   Fax:  [phone]  Patient Details  Name: Rhonda Harmon MRN: [id number] Date of Birth: 12-20-1965 Referring Provider:  No ref. provider found  Encounter Date: 12/08/2015 last encounter  PHYSICAL THERAPY DISCHARGE SUMMARY  Visits from Start of Care: 4  Current functional level related to goals / functional outcomes:     PT Short Term Goals - 11/10/15 1310      PT SHORT TERM GOAL #1   Title STG"s = LTG's         PT Long Term Goals - 12/08/15 1525      PT LONG TERM GOAL #1   Title Pt will independently perform vestibular HEP to maximize functional gains made in PT.  (Target: 12/08/15)   Baseline Met on 12/08/2015   Status Achieved     PT LONG TERM GOAL #2   Title Pt will decrease DHI score from 20 to < / = 2 to indicate significant decrease in pt-perceived disability due to dizziness.  (12/08/15)   Status On-going     PT LONG TERM GOAL #3   Title Complete FGA and write appropriate goal to assess/address dynamic gait stability.  (12/08/15)   Baseline Met on 11/13/2015- see updated goal below.   Status Achieved     PT LONG TERM GOAL #4   Title Dynamic visual acuity will be WNL to indicate improved VOR.  (12/08/15)   Status On-going     PT LONG TERM GOAL #5   Title Pt will independently ambulate >1,000' over unlevel paved and grass surfaces with no overt LOB to indicate improved stability with community mobility. (12/08/15)   Baseline Patient slows speed on pine straw, but no loss of balance.   Status Achieved     PT LONG TERM GOAL #6   Title Pt will independently negotiate 28 stairs with rails with no overt LOB to indicate increased safety accessing second-floor apartment.  (12/08/15)   Baseline Met on 12/05/2015   Status Achieved     PT LONG TERM GOAL #7   Title The patient will improve FGA from 24/30 to > or equal to 28/30 to  demonstrate improved dynamic balance for gait activities.   Baseline Target date 12/08/2015   Status On-going        Remaining deficits: Full reassessment not completed, as patient did not return for remaining visits.   Education / Equipment: HEP  Plan: Patient agrees to discharge.  Patient goals were partially met. Patient is being discharged due to not returning since the last visit.  ?????    Thank you for the referral of this patient. Rudell Cobb, MPT       Rhonda Harmon 03/06/2016, 10:03 AM  [facility] [address] [place], [place], [address] Phone: [phone]   Fax:  [phone]

## 2016-06-03 ENCOUNTER — Telehealth: Payer: Self-pay | Admitting: Neurology

## 2016-06-03 DIAGNOSIS — G930 Cerebral cysts: Secondary | ICD-10-CM

## 2016-06-03 NOTE — Telephone Encounter (Signed)
Recall order for MR brain for 07/2016 placed in EPIC.

## 2016-06-18 ENCOUNTER — Ambulatory Visit
Admission: RE | Admit: 2016-06-18 | Discharge: 2016-06-18 | Disposition: A | Discharge status: Home / Self Care | Payer: 59 | Source: Ambulatory Visit | Attending: Neurology | Admitting: Neurology

## 2016-06-18 DIAGNOSIS — G930 Cerebral cysts: Secondary | ICD-10-CM

## 2016-06-18 MED ORDER — GADOBENATE DIMEGLUMINE 529 MG/ML IV SOLN
12.0000 mL | Freq: Once | INTRAVENOUS | Status: AC | PRN
  Administered 2016-06-18: 12 mL via INTRAVENOUS

## 2016-06-24 ENCOUNTER — Telehealth: Payer: Self-pay

## 2016-06-24 NOTE — Telephone Encounter (Signed)
Clld pt - advsd of MRI results. Pt stated she understood and that's exactly what she wanted to hear!!!!

## 2016-06-24 NOTE — Telephone Encounter (Signed)
-----   Message from Van Clines, MD sent at 06/20/2016 12:44 PM EDT ----- Pls let her know brain MRI did not show any evidence of tumor, stroke or bleed. It is unchanged from the previous MRI. THanks

## 2016-08-20 ENCOUNTER — Ambulatory Visit: Payer: Managed Care, Other (non HMO) | Admitting: Neurology

## 2016-10-16 ENCOUNTER — Encounter: Payer: Self-pay | Admitting: Neurology

## 2016-10-16 ENCOUNTER — Ambulatory Visit (INDEPENDENT_AMBULATORY_CARE_PROVIDER_SITE_OTHER): Payer: 59 | Admitting: Neurology

## 2016-10-16 VITALS — BP 124/80 | HR 103 | Ht 62.0 in | Wt 138.0 lb

## 2016-10-16 DIAGNOSIS — H832X3 Labyrinthine dysfunction, bilateral: Secondary | ICD-10-CM | POA: Diagnosis not present

## 2016-10-16 DIAGNOSIS — G44219 Episodic tension-type headache, not intractable: Secondary | ICD-10-CM

## 2016-10-16 MED ORDER — NORTRIPTYLINE HCL 50 MG PO CAPS: 50.0000 mg | ORAL_CAPSULE | Freq: Every day | ORAL | 3 refills | Status: DC

## 2016-10-16 NOTE — Progress Notes (Signed)
NEUROLOGY FOLLOW UP OFFICE NOTE  Rhonda Harmon [id number]  HISTORY OF PRESENT ILLNESS: I had the pleasure of seeing Rhonda Harmon in follow-up in the neurology clinic on 10/16/2016.  The patient was last seen 8 months ago for headaches and vertigo. Headaches suggestive of tension-type headaches, she has had a good response to nortriptyline and requested to reduce dose to 50mg  qhs on last visit. She continues to do well on current dose, she has a headache around once a week and takes Excedrin with good effect. She has noticed her eye sight changes with lens changes each time she goes for a visit. There is no associated nausea/vomiting, photo/phonophobia. No focal numbness/tingling/weakness. She was referred for vestibular therapy for the vertigo which she reports was amazing. She has a constant low grade sensation of dysequilibrium, but it does not bother her. She notices it more when turning side to side wearing her glasses. She denies any neck/back pain, sleep is good, no falls.   I personally reviewed repeat MRI brain with and without contrast done 06/18/16 which did not show any acute changes, stable right sylvian fissure small arachnoid cyst without mass effect. Stable minimal chronic microvascular changes.   HPI 10/20/2015: This is a pleasant 51 yo Rhonda Harmon with a history of headaches and vertigo. Records from her previous neurologist in [place] were reviewed. She has been having dizziness since at least 2009, described as a constant unsteady sensation daily where she feels like she is getting off a cruise ship. No associated hearing loss or tinnitus. MRI brain with and without contrast done 2009 in [place] reported as normal. She had seen vestibular therapy in [place] and was diagnosed with bilateral peripheral vestibular dysfunction. She had significant improvement of symptoms with vestibular therapy. She started having occipital neuralgia in 2013 and was started on Nortriptyline. She reports this  helps with both the headaches and dizziness. She states the occipital neuralgia is better, she has pressure-like headaches over the bilateral temporal regions occurring around once a week, no associated nausea, vomiting, photo/phonophobia, visual obscurations. She occasionally wakes up with a headache. She takes prn Excedrin with good effect. She is taking nortriptyline 50mg  qhs with no side effects, sleep is good. She denies any diplopia, dysarthria/dysphagia, focal numbness/tingling/weakness. She has some neck pain. No bowel/bladder dysfunction. Memory is good, but she sometimes has a hard time focusing due to her balance issues. She fell 6 months ago while walking her dog. She denies any history of head injuries. Before she left [place], she reported worsening headaches and dizziness and had an MRI with [facility] done 5/79/17 without contrast which I personally reviewed, there was an 8 x 15mm increased CSF signal with asymmetric enlargement of the right sylvian fissure superiorly which may represent an arachnoid cyst. There is a 3mm hyperintensity in the right parietal white matter. No acute changes.   PAST MEDICAL HISTORY: Past Medical History:  Diagnosis Date  . Asthma   . Seasonal allergies   . Vestibular dysfunction     MEDICATIONS: Current Outpatient Prescriptions on File Prior to Visit  Medication Sig Dispense Refill  . nortriptyline (PAMELOR) 50 MG capsule Take 1 capsule (50 mg total) by mouth at bedtime. 90 capsule 3  . diazepam (VALIUM) 2 MG tablet Take by mouth.     No current facility-administered medications on file prior to visit.     ALLERGIES: Allergies  Allergen Reactions  . Other     Silk sutures   . Penicillins Rash  FAMILY HISTORY: Family History  Problem Relation Age of Onset  . Crohn's disease Sister   . Crohn's disease Sister     SOCIAL HISTORY: Social History   Social History  . Marital status: Married    Spouse name: N/A  . Number of  children: N/A  . Years of education: N/A   Occupational History  . Not on file.   Social History Main Topics  . Smoking status: Former Smoker    Quit date: 10/19/2000  . Smokeless tobacco: Never Used  . Alcohol use No  . Drug use: No  . Sexual activity: Not on file   Other Topics Concern  . Not on file   Social History Narrative  . No narrative on file    REVIEW OF SYSTEMS: Constitutional: No fevers, chills, or sweats, no generalized fatigue, change in appetite Eyes: No visual changes, double vision, eye pain Ear, nose and throat: No hearing loss, ear pain, nasal congestion, sore throat Cardiovascular: No chest pain, palpitations Respiratory:  No shortness of breath at rest or with exertion, wheezes GastrointestinaI: No nausea, vomiting, diarrhea, abdominal pain, fecal incontinence Genitourinary:  No dysuria, urinary retention or frequency Musculoskeletal:  No neck pain, back pain Integumentary: No rash, pruritus, skin lesions Neurological: as above Psychiatric: No depression, insomnia, anxiety Endocrine: No palpitations, fatigue, diaphoresis, mood swings, change in appetite, change in weight, increased thirst Hematologic/Lymphatic:  No anemia, purpura, petechiae. Allergic/Immunologic: no itchy/runny eyes, nasal congestion, recent allergic reactions, rashes  PHYSICAL EXAM: Vitals:   10/16/16 0834  BP: 124/80  Pulse: (!) 103   General: No acute distress Head:  Normocephalic/atraumatic Neck: supple, no paraspinal tenderness, full range of motion Heart:  Regular rate and rhythm Lungs:  Clear to auscultation bilaterally Back: No paraspinal tenderness Skin/Extremities: No rash, no edema Neurological Exam: alert and oriented to person, place, and time. No aphasia or dysarthria. Fund of knowledge is appropriate.  Recent and remote memory are intact.  Attention and concentration are normal.    Able to name objects and repeat phrases. Cranial nerves: Pupils equal, round,  reactive to light. Extraocular movements intact with no nystagmus. Visual fields full. Facial sensation intact. No facial asymmetry. Tongue, uvula, palate midline.  Motor: Bulk and tone normal, muscle strength 5/5 throughout with no pronator drift.  Sensation to light touch intact.  No extinction to double simultaneous stimulation.  Deep tendon reflexes 2+ throughout, toes downgoing.  Finger to nose testing intact.  Gait narrow-based and steady, able to tandem walk adequately.  Romberg negative.  IMPRESSION: This is a pleasant 50 yo Rhonda Harmon with a history of occipital neuralgia and bilateral peripheral vestibular dysfunction with chronic sensation of dysequilibrium. She had an MRI brain scan done 08/01/15 which showed an 8x63mm arachnoid cyst, interval follow-up MRI last March 2018 was unchanged. We discussed this is an incidental finding, no further indication to repeat imaging unless symptoms changes. She continues to have around 1 headache a week suggestive of tension-type headaches, satisfied with current control on nortriptyline 50mg  qhs. Vestibular therapy helped significantly with dizziness, continue HEP. She will follow-up in 1 year and knows to call for any changes.   Thank you for allowing me to participate in her care.  Please do not hesitate to call for any questions or concerns.  The duration of this appointment visit was 15 minutes of face-to-face time with the patient.  Greater than 50% of this time was spent in counseling, explanation of diagnosis, planning of further management, and coordination of care.  Patrcia DollyKaren Cloe Sockwell, M.D.    CC: Dr. Hyacinth MeekerMiller

## 2016-10-16 NOTE — Patient Instructions (Signed)
Great seeing you! Continue nortriptyline 50mg  daily. Continue home vestibular exercises. Follow-up in 1 year, call for any changes.

## 2017-10-16 ENCOUNTER — Ambulatory Visit: Payer: 59 | Admitting: Neurology

## 2017-10-16 ENCOUNTER — Other Ambulatory Visit: Payer: Self-pay

## 2017-10-16 ENCOUNTER — Encounter: Payer: Self-pay | Admitting: Neurology

## 2017-10-16 VITALS — BP 124/70 | HR 113 | Ht 62.0 in | Wt 145.0 lb

## 2017-10-16 DIAGNOSIS — G44219 Episodic tension-type headache, not intractable: Secondary | ICD-10-CM | POA: Diagnosis not present

## 2017-10-16 DIAGNOSIS — H832X3 Labyrinthine dysfunction, bilateral: Secondary | ICD-10-CM | POA: Diagnosis not present

## 2017-10-16 MED ORDER — TOPIRAMATE 25 MG PO TABS: ORAL_TABLET | ORAL | 6 refills | Status: DC

## 2017-10-16 MED ORDER — NORTRIPTYLINE HCL 50 MG PO CAPS: 50.0000 mg | ORAL_CAPSULE | Freq: Every day | ORAL | 3 refills | Status: DC

## 2017-10-16 NOTE — Patient Instructions (Addendum)
1. Start Topamax 25mg  every night. Keep a calendar of headaches and update me in a month on how you are feeling, we may increase dose if needed  2. Continue nortriptyline 50mg  every night  3. Follow-up in 6 months or so, call for any changes

## 2017-10-16 NOTE — Progress Notes (Signed)
NEUROLOGY FOLLOW UP OFFICE NOTE  [place] [id number]  DOB: [birth year]  HISTORY OF PRESENT ILLNESS: I had the pleasure of seeing Mkayla Steele in follow-up in the neurology clinic on 10/16/2017.  The patient was last seen a year ago for headaches and vertigo. Headaches suggestive of tension-type headaches, she has had a good response to nortriptyline and requested to reduce dose to 50mg  qhs. She reports headaches around once a week but they are not bothering her as much. If she gets on top of them and takes an Excedrin, she does much better, otherwise headache would last all day. She has occasional numbness in her left hand. No neck/back pain. She reports the dizziness is stable, she did really well with vestibular therapy in the past. She denies any vision changes, no falls.   HPI 10/20/2015: This is a pleasant 52 yo [name] with a history of headaches and vertigo. Records from her previous neurologist in [place] were reviewed. She has been having dizziness since at least 2009, described as a constant unsteady sensation daily where she feels like she is getting off a cruise ship. No associated hearing loss or tinnitus. MRI brain with and without contrast done 2009 in [place] reported as normal. She had seen vestibular therapy in [place] and was diagnosed with bilateral peripheral vestibular dysfunction. She had significant improvement of symptoms with vestibular therapy. She started having occipital neuralgia in 2013 and was started on Nortriptyline. She reports this helps with both the headaches and dizziness. She states the occipital neuralgia is better, she has pressure-like headaches over the bilateral temporal regions occurring around once a week, no associated nausea, vomiting, photo/phonophobia, visual obscurations. She occasionally wakes up with a headache. She takes prn Excedrin with good effect. She is taking nortriptyline 50mg  qhs with no side effects, sleep is good. She denies any diplopia,  dysarthria/dysphagia, focal numbness/tingling/weakness. She has some neck pain. No bowel/bladder dysfunction. Memory is good, but she sometimes has a hard time focusing due to her balance issues. She fell 6 months ago while walking her dog. She denies any history of head injuries. Before she left [place], she reported worsening headaches and dizziness and had an MRI with [facility] done 5/79/17 without contrast which I personally reviewed, there was an 8 x 15mm increased CSF signal with asymmetric enlargement of the right sylvian fissure superiorly which may represent an arachnoid cyst. There is a 3mm hyperintensity in the right parietal white matter. No acute changes.   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 Stable minimal chronic microvascular changes.    PAST MEDICAL HISTORY: Past Medical History:  Diagnosis Date  . Asthma   . Seasonal allergies   . Vestibular dysfunction     MEDICATIONS: Current Outpatient Medications on File Prior to Visit  Medication Sig Dispense Refill  . cetirizine (ZYRTEC) 10 MG tablet Take 10 mg by mouth daily.    . diazepam (VALIUM) 2 MG tablet Take by mouth.    . nortriptyline (PAMELOR) 50 MG capsule Take 1 capsule (50 mg total) by mouth at bedtime. 90 capsule 3   No current facility-administered medications on file prior to visit.     ALLERGIES: Allergies  Allergen Reactions  . Other     Silk sutures   . Penicillins Rash    FAMILY HISTORY: Family History  Problem Relation Age of  Onset  . Crohn's disease Sister   . Crohn's disease Sister     SOCIAL HISTORY: Social History   Socioeconomic History  . Marital status: Married    Spouse name: Not on file  . Number of children: Not on file  . Years of education: Not on file  . Highest education level: Not on file  Occupational  History  . Not on file  Social Needs  . Financial resource strain: Not on file  . Food insecurity:    Worry: Not on file    Inability: Not on file  . Transportation needs:    Medical: Not on file    Non-medical: Not on file  Tobacco Use  . Smoking status: Former Smoker    Last attempt to quit: 10/19/2000    Years since quitting: 17.0  . Smokeless tobacco: Never Used  Substance and Sexual Activity  . Alcohol use: No  . Drug use: No  . Sexual activity: Not on file  Lifestyle  . Physical activity:    Days per week: Not on file    Minutes per session: Not on file  . Stress: Not on file  Relationships  . Social connections:    Talks on phone: Not on file    Gets together: Not on file    Attends religious service: Not on file    Active member of club or organization: Not on file    Attends meetings of clubs or organizations: Not on file    Relationship status: Not on file  . Intimate partner violence:    Fear of current or ex partner: Not on file    Emotionally abused: Not on file    Physically abused: Not on file    Forced sexual activity: Not on file  Other Topics Concern  . Not on file  Social History Narrative  . Not on file    REVIEW OF SYSTEMS: Constitutional: No fevers, chills, or sweats, no generalized fatigue, change in appetite Eyes: No visual changes, double vision, eye pain Ear, nose and throat: No hearing loss, ear pain, nasal congestion, sore throat Cardiovascular: No chest pain, palpitations Respiratory:  No shortness of breath at rest or with exertion, wheezes GastrointestinaI: No nausea, vomiting, diarrhea, abdominal pain, fecal incontinence Genitourinary:  No dysuria, urinary retention or frequency Musculoskeletal:  No neck pain, back pain Integumentary: No rash, pruritus, skin lesions Neurological: as above Psychiatric: No depression, insomnia, anxiety Endocrine: No palpitations, fatigue, diaphoresis, mood swings, change in appetite, change in  weight, increased thirst Hematologic/Lymphatic:  No anemia, purpura, petechiae. Allergic/Immunologic: no itchy/runny eyes, nasal congestion, recent allergic reactions, rashes  PHYSICAL EXAM: Vitals:   10/16/17 0834  BP: 124/70  Pulse: (!) 113  SpO2: 98%   General: No acute distress Head:  Normocephalic/atraumatic Neck: supple, no paraspinal tenderness, full range of motion Heart:  Regular rate and rhythm Lungs:  Clear to auscultation bilaterally Back: No paraspinal tenderness Skin/Extremities: No rash, no edema Neurological Exam: alert and oriented to person, place, and time. No aphasia or dysarthria. Fund of knowledge is appropriate.  Recent and remote memory are intact.  Attention and concentration are normal.    Able to name objects and repeat phrases. Cranial nerves: Pupils equal, round, reactive to light. Extraocular movements intact with no nystagmus. Visual fields full. Facial sensation intact. No facial asymmetry. Tongue, uvula, palate midline.  Motor: Bulk and tone normal, muscle strength 5/5 throughout with no pronator drift.  Sensation to light touch intact.  No extinction to double  simultaneous stimulation.  Deep tendon reflexes 2+ throughout, toes downgoing.  Finger to nose testing intact.  Gait narrow-based and steady, able to tandem walk adequately.  Romberg negative.  IMPRESSION: This is a pleasant 52 yo [name] with a history of occipital neuralgia and bilateral peripheral vestibular dysfunction with chronic sensation of dysequilibrium. MRI brain done March 2018 showed stable 8x36mm arachnoid cyst, she is asymptomatic from this. She reports around 1 headache a week but had side effects on higher nortriptyline dose, she agrees to add on a different headache preventative medication, Topamax, start 25mg  qhs, side effects discussed. Vestibular therapy helped significantly with dizziness, continue HEP. She was advised to keep a calendar of headaches and call our office for an update  in a month, we may uptitrate Topamax as tolerated. Follow-up in 6 months, she knows to call for any changes.   Thank you for allowing me to participate in her care.  Please do not hesitate to call for any questions or concerns.  The duration of this appointment visit was 27 minutes of face-to-face time with the patient.  Greater than 50% of this time was spent in counseling, explanation of diagnosis, planning of further management, and coordination of care.   Patrcia Dolly, M.D.    CC: Dr. Hyacinth Meeker

## 2017-10-27 ENCOUNTER — Encounter: Payer: Self-pay | Admitting: Neurology

## 2017-11-28 ENCOUNTER — Telehealth: Payer: Self-pay | Admitting: Neurology

## 2017-11-28 MED ORDER — GABAPENTIN 100 MG PO CAPS: 100.0000 mg | ORAL_CAPSULE | Freq: Every day | ORAL | 1 refills | Status: DC

## 2017-11-28 NOTE — Telephone Encounter (Signed)
Medication was Topamax 25 mg at night. Please advise.

## 2017-11-28 NOTE — Telephone Encounter (Signed)
I don't see from her records that she has tried gabapentin in the past, if she would like to try it, would do a very low dose and see how she tolerates it first. Main side effect is drowsiness, take gabapentin 100mg  qhs and update [place] on how she is feeling in a month, we can increase the dose if needed and no side effects. Thanks

## 2017-11-28 NOTE — Telephone Encounter (Signed)
Spoke with patient. She does want to try another medication, whatever you suggest.  Please advise.

## 2017-11-28 NOTE — Telephone Encounter (Signed)
Is she interested in trying a different medication in addition to the nortriptyline for her headaches? Thanks

## 2017-11-28 NOTE — Telephone Encounter (Signed)
Patient called stating that the new recent medication she started taking is making her stomach upset. She has had enough of it so stopped taking it. She couldn't remember the name of the medication but she stated it was the only medication Dr.Aquino has prescribed for her. Please call her back at [phone]. Thanks!

## 2017-11-28 NOTE — Telephone Encounter (Signed)
Patient has not had this in the past. She is agreeable to try. RX sent to pharmacy. She will update [place] in a month.

## 2017-11-28 NOTE — Telephone Encounter (Signed)
Left message on machine for patient to call back.

## 2018-03-03 ENCOUNTER — Telehealth: Payer: Self-pay | Admitting: Neurology

## 2018-03-03 NOTE — Telephone Encounter (Signed)
Patient called and is needing to speak with you regarding her most recent Medication that she was prescribed. She could not remember the name of the medication. Please Call. Thanks

## 2018-03-03 NOTE — Telephone Encounter (Signed)
Returned call to pt.  No answer.  LMOM asking for return call.  

## 2018-03-03 NOTE — Telephone Encounter (Signed)
Pt returned my call.   States that she has been taking Gabapentin for about 2 months now (pls see previous phone encounter) and it is upsetting her stomach.  Pt asks if there is another medication she may be able to switch to.  I let pt know that I will send message to Dr. Karel JarvisAquino, but that I am leaving the office soon so I may not be able to return call until tomorrow.  Pt states that is fine.  She still has some Nortriptyline on hand and will take that instead of Gabapentin until she hears back from our office.  States that Nortriptyline upsets her stomach, but not as much as Gabapentin.  pls advise.

## 2018-03-04 NOTE — Telephone Encounter (Signed)
Sounds like the nortriptyline, Topamax, and Gabapentin have all caused stomach issues. I am concerned that starting another medication may just do the same. Would she consider taking a medication to help protect stomach 30 mins before taking the nortriptyline? If yes, try Prilosec 20mg  every evening, then at bedtime take nortriptyline. Thanks

## 2018-03-10 NOTE — Telephone Encounter (Signed)
Spoke with pt. Asked how she is doing since switching back to Nortriptyline last week.  Pt states that she is doing better than when she was taking Gabapentin.  She states that her stomach is not longer upset.  I did advise that if her stomach becomes upset again to take Prilosec 20mg  an hour or 2 prior to taking Nortriptyline.  Pt verbalized understanding.

## 2018-06-05 ENCOUNTER — Ambulatory Visit: Payer: 59 | Admitting: Neurology

## 2018-06-08 ENCOUNTER — Ambulatory Visit: Payer: 59 | Admitting: Neurology

## 2018-06-08 ENCOUNTER — Other Ambulatory Visit: Payer: Self-pay

## 2018-11-15 ENCOUNTER — Other Ambulatory Visit: Payer: Self-pay | Admitting: Neurology

## 2018-11-15 DIAGNOSIS — G44219 Episodic tension-type headache, not intractable: Secondary | ICD-10-CM

## 2018-12-01 ENCOUNTER — Telehealth: Payer: Self-pay | Admitting: Neurology

## 2018-12-01 NOTE — Telephone Encounter (Signed)
Patient left a VM and wants to know if her nortiptyline has been approved  Please call

## 2018-12-02 ENCOUNTER — Telehealth: Payer: Self-pay | Admitting: Neurology

## 2018-12-02 ENCOUNTER — Other Ambulatory Visit: Payer: Self-pay

## 2018-12-02 DIAGNOSIS — G44219 Episodic tension-type headache, not intractable: Secondary | ICD-10-CM

## 2018-12-02 MED ORDER — NORTRIPTYLINE HCL 50 MG PO CAPS
50.0000 mg | ORAL_CAPSULE | Freq: Every day | ORAL | 1 refills | Status: DC
Start: 2018-12-02 — End: 2019-01-12

## 2018-12-02 NOTE — Telephone Encounter (Signed)
Pt is needing a refill on [facility]ript[facility]

## 2018-12-02 NOTE — Telephone Encounter (Signed)
Patient called and left a message with Access Nurse, Festus Aloe, RN:  Caller states she needs a refill of her Nortriptyline called in to her pharmacy. She said she takes it for imbalance and migraines. She is not having and new or worsening symptoms.  [facility] [phone]

## 2018-12-02 NOTE — Telephone Encounter (Signed)
Medication sent with 2 refills. An appt has been made for October 2020.

## 2018-12-03 NOTE — Telephone Encounter (Signed)
Medication was refilled yesterday. Old telephone message.

## 2019-01-12 ENCOUNTER — Encounter

## 2019-01-12 ENCOUNTER — Ambulatory Visit (INDEPENDENT_AMBULATORY_CARE_PROVIDER_SITE_OTHER): Payer: 59 | Admitting: Neurology

## 2019-01-12 ENCOUNTER — Encounter: Payer: Self-pay | Admitting: Neurology

## 2019-01-12 ENCOUNTER — Other Ambulatory Visit: Payer: Self-pay

## 2019-01-12 ENCOUNTER — Other Ambulatory Visit (INDEPENDENT_AMBULATORY_CARE_PROVIDER_SITE_OTHER): Payer: 59

## 2019-01-12 VITALS — BP 102/74 | HR 92 | Ht 62.0 in | Wt 140.0 lb

## 2019-01-12 DIAGNOSIS — G44219 Episodic tension-type headache, not intractable: Secondary | ICD-10-CM

## 2019-01-12 DIAGNOSIS — R413 Other amnesia: Secondary | ICD-10-CM

## 2019-01-12 DIAGNOSIS — H832X3 Labyrinthine dysfunction, bilateral: Secondary | ICD-10-CM | POA: Diagnosis not present

## 2019-01-12 MED ORDER — ESOMEPRAZOLE MAGNESIUM 20 MG PO CPDR: 20.0000 mg | DELAYED_RELEASE_CAPSULE | Freq: Every day | ORAL | 2 refills | Status: AC

## 2019-01-12 MED ORDER — NORTRIPTYLINE HCL 50 MG PO CAPS: 50.0000 mg | ORAL_CAPSULE | Freq: Every day | ORAL | 3 refills | Status: DC

## 2019-01-12 NOTE — Progress Notes (Signed)
NEUROLOGY FOLLOW UP OFFICE NOTE  Rhonda Harmon [id number]  DOB: Aug 09, 1965  HISTORY OF PRESENT ILLNESS: I had the pleasure of seeing Rhonda Harmon in follow-up in the neurology clinic on 01/12/2019.  The patient was last seen over a year ago for headaches and vertigo. Headaches suggestive of tension-type headaches, she had been on nortriptyline 50mg  qhs since 2013 with infrequent headaches, however on last visit requested to switch to a different medication (side effects on higher dose nortriptyline). She reports that low dose Topiramate 25mg  qhs helped her a lot, she also felt clearer on this (had stopped nortriptyline), but had significant GI issues with diarrhea the next day. She had tried taking it with Prilosec with no improvement. She has been back to nortriptyline 50mg  qhs and has headaches 1-2 times a month, with good response to prn Excedrin. She has not really noticed the vertigo. She has occasional tingling in her fingers and toes. Sleep is much better on the nortriptyline, she usually gets 7 hours of sleep, rare daytime drowsiness. Her main concern today is her memory, she has been noticing more difficulties the past 6 months and started an over the counter supplement to supposedly help with memory. 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 She denies getting lost driving or missing medications. Her husband manages finances. She endorses a lot of stress. No family history of dementia, no history of concussions or alcohol use.   History on Initial Assessment 10/20/2015: This is a pleasant 53 yo [name] with a history of headaches and vertigo. Records from her previous neurologist in [place] were reviewed. She has been having dizziness since at least 2009, described as a constant unsteady sensation daily where she feels like she is getting off a cruise ship. No associated hearing loss or tinnitus. MRI brain with and without contrast  done 2009 in [place] reported as normal. She had seen vestibular therapy in [place] and was diagnosed with bilateral peripheral vestibular dysfunction. She had significant improvement of symptoms with vestibular therapy. She started having occipital neuralgia in 2013 and was started on Nortriptyline. She reports this helps with both the headaches and dizziness. She states the occipital neuralgia is better, she has pressure-like headaches over the bilateral temporal regions occurring around once a week, no associated nausea, vomiting, photo/phonophobia, visual obscurations. She occasionally wakes up with a headache. She takes prn Excedrin with good effect. She is taking nortriptyline 50mg  qhs with no side effects, sleep is good. She denies any diplopia, dysarthria/dysphagia, focal numbness/tingling/weakness. She has some neck pain. No bowel/bladder dysfunction. Memory is good, but she sometimes has a hard time focusing due to her balance issues. She fell 6 months ago while walking her dog. She denies any history of head injuries. Before she left [place], she reported worsening headaches and dizziness and had an MRI with [facility] done 5/79/17 without contrast which I personally reviewed, there was an 8 x 79mm increased CSF signal with asymmetric enlargement of the right sylvian fissure superiorly which may represent an arachnoid cyst. There is a 40mm hyperintensity in the right parietal white matter. No acute changes.   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 Stable minimal chronic microvascular changes.   Prior Preventatives: Topiramate, Gabapentin, nortriptyline  PAST MEDICAL HISTORY: Past  Medical History:  Diagnosis Date   Asthma    Seasonal allergies    Vestibular dysfunction     MEDICATIONS: Outpatient Encounter Medications as of  01/12/2019  Medication Sig Note   cetirizine (ZYRTEC) 10 MG tablet Take 10 mg by mouth daily.    diazepam (VALIUM) 2 MG tablet Take by mouth. 10/20/2015: Received from: [facility] Received Sig: Take 1 Tab (2 mg total) by mouth every 8 hours as needed for Sleep   nortriptyline (PAMELOR) 50 MG capsule Take 1 capsule (50 mg total) by mouth at bedtime.                        No facility-administered encounter medications on file as of 01/12/2019.    ALLERGIES: Allergies  Allergen Reactions   Other     Silk sutures    Penicillins Rash    FAMILY HISTORY: Family History  Problem Relation Age of Onset   Crohn's disease Sister    Crohn's disease Sister     SOCIAL HISTORY: Social History   Socioeconomic History   Marital status: Married    Spouse name: Not on file   Number of children: Not on file   Years of education: Not on file   Highest education level: Not on file  Occupational History   Not on file  Social Needs   Financial resource strain: Not on file   Food insecurity    Worry: Not on file    Inability: Not on file   Transportation needs    Medical: Not on file    Non-medical: Not on file  Tobacco Use   Smoking status: Former Smoker    Quit date: 10/19/2000    Years since quitting: 18.2   Smokeless tobacco: Never Used  Substance and Sexual Activity   Alcohol use: No   Drug use: No   Sexual activity: Not on file  Lifestyle   Physical activity    Days per week: Not on file    Minutes per session: Not on file   Stress: Not on file  Relationships   Social connections    Talks on phone: Not on file    Gets together: Not on file    Attends religious service: Not on file    Active member of club or organization: Not on file    Attends meetings of clubs or organizations: Not on file    Relationship status: Not on file   Intimate partner violence    Fear of current or ex partner: Not on file    Emotionally abused: Not on file     Physically abused: Not on file    Forced sexual activity: Not on file  Other Topics Concern   Not on file  Social History Narrative   Not on file    REVIEW OF SYSTEMS: Constitutional: No fevers, chills, or sweats, no generalized fatigue, change in appetite Eyes: No visual changes, double vision, eye pain Ear, nose and throat: No hearing loss, ear pain, nasal congestion, sore throat Cardiovascular: No chest pain, palpitations Respiratory:  No shortness of breath at rest or with exertion, wheezes GastrointestinaI: No nausea, vomiting, diarrhea, abdominal pain, fecal incontinence Genitourinary:  No dysuria, urinary retention or frequency Musculoskeletal:  No neck pain, back pain Integumentary: No rash, pruritus, skin lesions Neurological: as above Psychiatric: No depression, insomnia, anxiety Endocrine: No palpitations, fatigue, diaphoresis, mood swings, change in appetite, change in weight, increased thirst Hematologic/Lymphatic:  No anemia, purpura, petechiae. Allergic/Immunologic: no  itchy/runny eyes, nasal congestion, recent allergic reactions, rashes  PHYSICAL EXAM: Vitals:   01/12/19 0839  BP: 102/74  Pulse: 92  SpO2: 99%   General: No acute distress Head:  Normocephalic/atraumatic Skin/Extremities: No rash, no edema Neurological Exam: alert and oriented to person, place, and time. No aphasia or dysarthria. Fund of knowledge is appropriate.  Recent and remote memory are intact.  Attention and concentration are normal.    Able to name objects and repeat phrases.  [employer] Mental Exam 01/12/2019  Weekday Correct 1  Current year 1  What state are we in? 1  Amount spent 1  Amount left 2  # of Animals 3  5 objects recall 5  Number series 2  Hour markers 2  Time correct 0  Placed X in triangle correctly 1  Largest Figure 1  Name of female 2  Date back to work 2  Type of work 2  State she lived in 2  Total score 28    Cranial nerves: Pupils equal,  round, reactive to light. Extraocular movements intact with no nystagmus. Visual fields full. Facial sensation intact. No facial asymmetry. Tongue, uvula, palate midline.  Motor: Bulk and tone normal, muscle strength 5/5 throughout with no pronator drift.  Deep tendon reflexes 2+ throughout, toes downgoing.  Finger to nose testing intact.  Gait narrow-based and steady, able to tandem walk adequately.  Romberg negative.  IMPRESSION: This is a pleasant 53 yo [name] with a history of occipital neuralgia and bilateral peripheral vestibular dysfunction with chronic sensation of dysequilibrium. MRI brain done March 2018 showed stable 8x42mm arachnoid cyst, she is asymptomatic from this. She would like another trial of Topamax for headache prophylaxis since she felt clearer compared to nortriptyline. She will try taking Topamax with food and Nexium daily. Continue nortriptyline 50mg  qhs for now, it also helps with sleep. Dizziness has been controlled, she does HEP when needed. She is reporting more memory issues affecting work as well, SLUMS score today normal 28/30. Check TSH and B12. We discussed how stress can also affect memory. She will be scheduled for Neurocognitive testing to further evaluate memory complaints. Follow-up in 6 months, she knows to call for any changes.   Thank you for allowing me to participate in her care.  Please do not hesitate to call for any questions or concerns.   , M.D.    CC: Dr. Patrcia Dolly

## 2019-01-12 NOTE — Patient Instructions (Signed)
1. Restart Topamax 25mg  at bedtime  2. Stay on the nortriptyline 50mg  at bedtime, if Topamax is tolerated better, we can either stop the nortriptyline or stay on both  3. Take Nexium 20mg  daily for a month, call if no change in stomach issues  4. Bloodwork for TSH, B12  5. Refer for Neurocognitive testing  6. Follow-up in 6 months, call for any changes

## 2019-01-13 ENCOUNTER — Telehealth: Payer: Self-pay

## 2019-01-13 LAB — TSH: TSH: 1.29 mIU/L

## 2019-01-13 LAB — VITAMIN B12: Vitamin B-12: 833 pg/mL (ref 200–1100)

## 2019-01-13 NOTE — Telephone Encounter (Signed)
-----   Message from Karen M Aquino, MD sent at 01/13/2019  9:04 AM EDT ----- Pls let patient know thyroid and B12 levels are normal, thanks!  

## 2019-01-13 NOTE — Telephone Encounter (Signed)
-----   Message from Cameron Sprang, MD sent at 01/13/2019  9:04 AM EDT ----- Pls let patient know thyroid and B12 levels are normal, thanks!

## 2019-01-13 NOTE — Telephone Encounter (Signed)
Pt called on labs.

## 2019-01-13 NOTE — Telephone Encounter (Signed)
Advised patient of labs results.

## 2019-02-09 ENCOUNTER — Telehealth: Payer: Self-pay

## 2019-02-09 NOTE — Telephone Encounter (Signed)
Prior authorization request for nexium received.  Per Dr Delice Lesch, patient can just use over the counter.  Left message advising patient of this.

## 2019-02-12 ENCOUNTER — Telehealth: Payer: Self-pay | Admitting: Neurology

## 2019-02-12 NOTE — Telephone Encounter (Signed)
1. Patient called and said she needs refills on a medication she is to start taking again. But, she does not remember the name of the medication. She needs assistance from the medical assistant with this.  2. Patient stated she needs blood work work and she'd like to speak with a Psychologist, sport and exercise about this as well.  [facility]

## 2019-02-12 NOTE — Telephone Encounter (Signed)
Patient returned call to Ashley  

## 2019-02-15 ENCOUNTER — Other Ambulatory Visit: Payer: Self-pay

## 2019-02-15 MED ORDER — TOPIRAMATE 25 MG PO TABS: 25.0000 mg | ORAL_TABLET | Freq: Every day | ORAL | 3 refills | Status: DC

## 2019-02-15 NOTE — Telephone Encounter (Signed)
Per Dr. Amparo Bristol office note 01/12/19. Pt was to start on Topamax 25mg  qhs along with Nexium. Pt can continue with Nortriptyline if she wishes. Pt to call back with any concerns.  Pt questioned where she could get blood work and an u/s for her liver. Her PCP who is not located in [place] is wanting her to have these done. Instructed pt that she could go to [address] for the U/S and could come to our building for the labs. She will call her PCP office and have them fax orders.

## 2019-02-16 ENCOUNTER — Telehealth: Payer: Self-pay

## 2019-02-16 NOTE — Telephone Encounter (Signed)
Pt has a PCP in [place]. The PCP wants specific labs drawn for pts liver. Pt does not want to drive to [place]. Would you be able to put in the orders that the PCP wants? That office faxed over the orders. Kieth Brightly in the lab said that she could not accept those orders because you did not order them.

## 2019-02-17 ENCOUNTER — Telehealth: Payer: Self-pay

## 2019-02-17 NOTE — Telephone Encounter (Signed)
Pt has PCP in [place]. The PCP would like for pt to have an u/s of liver along with labs. Orders from their office faxed here. Faxed [facility] order for u/s. Asked them to contact the [place] PCP office for Prior Auth. Faxed lab orders to [facility] on [address] since our lab is not allowed to draw outside providers labs.  [facility] [address] # [phone] fax [phone]. Asked them to fax [place] office and our office the results.  Pt made aware of [facility] location and hours which are M-F 8-5. Closed for lunch 12-1

## 2019-02-17 NOTE — Telephone Encounter (Signed)
Recommend going to different lab that can just fax the results of their requested labs to requesting provider.

## 2019-02-17 NOTE — Telephone Encounter (Signed)
Spoke with patient and advised her to have PCP fax order to lab.  Patient agreeable.

## 2019-02-22 ENCOUNTER — Ambulatory Visit
Admission: RE | Admit: 2019-02-22 | Discharge: 2019-02-22 | Disposition: A | Discharge status: Home / Self Care | Payer: 59 | Source: Ambulatory Visit | Attending: Family Medicine | Admitting: Family Medicine

## 2019-02-22 ENCOUNTER — Other Ambulatory Visit: Payer: Self-pay

## 2019-02-22 ENCOUNTER — Other Ambulatory Visit: Payer: Self-pay | Admitting: Family Medicine

## 2019-02-22 DIAGNOSIS — R7989 Other specified abnormal findings of blood chemistry: Secondary | ICD-10-CM

## 2019-02-22 DIAGNOSIS — Z1231 Encounter for screening mammogram for malignant neoplasm of breast: Secondary | ICD-10-CM

## 2019-02-22 DIAGNOSIS — R748 Abnormal levels of other serum enzymes: Secondary | ICD-10-CM

## 2019-02-22 DIAGNOSIS — R945 Abnormal results of liver function studies: Secondary | ICD-10-CM

## 2019-02-25 ENCOUNTER — Ambulatory Visit
Admission: RE | Admit: 2019-02-25 | Discharge: 2019-02-25 | Disposition: A | Discharge status: Home / Self Care | Payer: 59 | Source: Ambulatory Visit | Attending: Family Medicine | Admitting: Family Medicine

## 2019-02-25 ENCOUNTER — Other Ambulatory Visit: Payer: Self-pay | Admitting: Family Medicine

## 2019-02-25 DIAGNOSIS — R945 Abnormal results of liver function studies: Secondary | ICD-10-CM

## 2019-02-25 DIAGNOSIS — R7989 Other specified abnormal findings of blood chemistry: Secondary | ICD-10-CM

## 2019-02-25 DIAGNOSIS — R748 Abnormal levels of other serum enzymes: Secondary | ICD-10-CM

## 2019-03-31 ENCOUNTER — Encounter: Payer: Self-pay | Admitting: Gastroenterology

## 2019-04-19 ENCOUNTER — Ambulatory Visit (AMBULATORY_SURGERY_CENTER): Payer: Self-pay | Admitting: *Deleted

## 2019-04-19 ENCOUNTER — Other Ambulatory Visit: Payer: Self-pay

## 2019-04-19 VITALS — Temp 96.2°F | Ht 62.0 in | Wt 150.0 lb

## 2019-04-19 DIAGNOSIS — Z01818 Encounter for other preprocedural examination: Secondary | ICD-10-CM

## 2019-04-19 DIAGNOSIS — Z1211 Encounter for screening for malignant neoplasm of colon: Secondary | ICD-10-CM

## 2019-04-19 MED ORDER — SUPREP BOWEL PREP KIT 17.5-3.13-1.6 GM/177ML PO SOLN: 1.0000 | Freq: Once | ORAL | 0 refills | Status: AC

## 2019-04-19 NOTE — Progress Notes (Signed)
No egg or soy allergy known to patient  No issues with past sedation with any surgeries  or procedures, no intubation problems  No diet pills per patient No home 02 use per patient  No blood thinners per patient  Pt denies issues with constipation  No A fib or A flutter  EMMI video sent to pt's e mail   Due to the COVID-19 pandemic we are asking patients to follow these guidelines. Please only bring one care partner. Please be aware that your care partner may wait in the car in the parking lot or if they feel like they will be too hot to wait in the car, they may wait in the lobby on the 4th floor. All care partners are required to wear a mask the entire time (we do not have any that we can provide them), they need to practice social distancing, and we will do a Covid check for all patient's and care partners when you arrive. Also we will check their temperature and your temperature. If the care partner waits in their car they need to stay in the parking lot the entire time and we will call them on their cell phone when the patient is ready for discharge so they can bring the car to the front of the building. Also all patient's will need to wear a mask into building. [employer]

## 2019-04-27 ENCOUNTER — Encounter: Payer: Self-pay | Admitting: Gastroenterology

## 2019-04-28 ENCOUNTER — Ambulatory Visit (INDEPENDENT_AMBULATORY_CARE_PROVIDER_SITE_OTHER): Payer: Managed Care, Other (non HMO)

## 2019-04-28 ENCOUNTER — Other Ambulatory Visit: Payer: Self-pay | Admitting: Gastroenterology

## 2019-04-28 DIAGNOSIS — Z1159 Encounter for screening for other viral diseases: Secondary | ICD-10-CM

## 2019-04-28 LAB — SARS CORONAVIRUS 2 (TAT 6-24 HRS): SARS Coronavirus 2: NEGATIVE

## 2019-05-03 ENCOUNTER — Ambulatory Visit (AMBULATORY_SURGERY_CENTER): Payer: Managed Care, Other (non HMO) | Admitting: Gastroenterology

## 2019-05-03 ENCOUNTER — Encounter: Payer: Self-pay | Admitting: Gastroenterology

## 2019-05-03 ENCOUNTER — Other Ambulatory Visit: Payer: Self-pay

## 2019-05-03 VITALS — BP 139/70 | HR 90 | Temp 96.0°F | Resp 17 | Ht 62.0 in | Wt 150.0 lb

## 2019-05-03 DIAGNOSIS — Z1211 Encounter for screening for malignant neoplasm of colon: Secondary | ICD-10-CM

## 2019-05-03 MED ORDER — SODIUM CHLORIDE 0.9 % IV SOLN: 500.0000 mL | Freq: Once | INTRAVENOUS | Status: DC

## 2019-05-03 NOTE — Progress Notes (Signed)
A and O x3. Report to RN. Tolerated MAC anesthesia well.

## 2019-05-03 NOTE — Op Note (Signed)
[facility] Patient Name: Rhonda Harmon Procedure Date: 05/03/2019 10:07 AM MRN: [id number] Endoscopist: Napoleon Form , MD Age: 54 Referring MD:  Date of Birth: [birth year] Gender: Female Account #: [id number] Procedure:                Colonoscopy Indications:              Screening for colorectal malignant neoplasm Medicines:                Monitored Anesthesia Care Procedure:                Pre-Anesthesia Assessment:                           - Prior to the procedure, a History and Physical                            was performed, and patient medications and                            allergies were reviewed. The patient's tolerance of                            previous anesthesia was also reviewed. The risks                            and benefits of the procedure and the sedation                            options and risks were discussed with the patient.                            All questions were answered, and informed consent                            was obtained. Prior Anticoagulants: The patient has                            taken no previous anticoagulant or antiplatelet                            agents. ASA Grade Assessment: II - A patient with                            mild systemic disease. After reviewing the risks                            and benefits, the patient was deemed in                            satisfactory condition to undergo the procedure.                           After obtaining informed consent, the colonoscope  was passed under direct vision. Throughout the                            procedure, the patient's blood pressure, pulse, and                            oxygen saturations were monitored continuously. The                            Colonoscope was introduced through the anus and                            advanced to the the cecum, identified by                            appendiceal orifice and  ileocecal valve. The                            colonoscopy was performed without difficulty. The                            patient tolerated the procedure well. The quality                            of the bowel preparation was excellent. The                            ileocecal valve, appendiceal orifice, and rectum                            were photographed. Scope In: 10:14:48 AM Scope Out: 10:27:39 AM Scope Withdrawal Time: 0 hours 9 minutes 44 seconds  Total Procedure Duration: 0 hours 12 minutes 51 seconds  Findings:                 The perianal and digital rectal examinations were                            normal.                           Non-bleeding internal hemorrhoids were found during                            retroflexion. The hemorrhoids were small.                           A patchy area of mildly erythematous mucosa was                            found in the distal rectum.                           The exam was otherwise without abnormality. Complications:            No immediate complications. Estimated Blood Loss:  Estimated blood loss was minimal. Impression:               - Non-bleeding internal hemorrhoids.                           - Erythematous mucosa in the distal rectum.                           - The examination was otherwise normal.                           - No specimens collected. Recommendation:           - Patient has a contact number available for                            emergencies. The signs and symptoms of potential                            delayed complications were discussed with the                            patient. Return to normal activities tomorrow.                            Written discharge instructions were provided to the                            patient.                           - Resume previous diet.                           - Continue present medications.                           - Repeat colonoscopy in 10  years for screening                            purposes. Mauri Pole, MD 05/03/2019 10:33:33 AM This report has been signed electronically.

## 2019-05-03 NOTE — Progress Notes (Signed)
Pt's states no medical or surgical changes since previsit or office visit.  SM IV, DT vitals and LC temp. 

## 2019-05-03 NOTE — Patient Instructions (Signed)
YOU HAD AN ENDOSCOPIC PROCEDURE TODAY AT THE [facility]:   Refer to the procedure report that was given to you for any specific questions about what was found during the examination.  If the procedure report does not answer your questions, please call your gastroenterologist to clarify.  If you requested that your care partner not be given the details of your procedure findings, then the procedure report has been included in a sealed envelope for you to review at your convenience later.  YOU SHOULD EXPECT: Some feelings of bloating in the abdomen. Passage of more gas than usual.  Walking can help get rid of the air that was put into your GI tract during the procedure and reduce the bloating. If you had a lower endoscopy (such as a colonoscopy or flexible sigmoidoscopy) you may notice spotting of blood in your stool or on the toilet paper. If you underwent a bowel prep for your procedure, you may not have a normal bowel movement for a few days.  Please Note:  You might notice some irritation and congestion in your nose or some drainage.  This is from the oxygen used during your procedure.  There is no need for concern and it should clear up in a day or so.  SYMPTOMS TO REPORT IMMEDIATELY:   Following lower endoscopy (colonoscopy or flexible sigmoidoscopy):  Excessive amounts of blood in the stool  Significant tenderness or worsening of abdominal pains  Swelling of the abdomen that is new, acute  Fever of 100F or higher   For urgent or emergent issues, a gastroenterologist can be reached at any hour by calling [phone].   DIET:  We do recommend a small meal at first, but then you may proceed to your regular diet.  Drink plenty of fluids but you should avoid alcoholic beverages for 24 hours.  MEDICATIONS: Continue present medications.  Please see handouts given to you by your recovery nurse.  ACTIVITY:  You should plan to take it easy for the rest of today and you should  NOT DRIVE or use heavy machinery until tomorrow (because of the sedation medicines used during the test).    FOLLOW UP: Our staff will call the number listed on your records 48-72 hours following your procedure to check on you and address any questions or concerns that you may have regarding the information given to you following your procedure. If we do not reach you, we will leave a message.  We will attempt to reach you two times.  During this call, we will ask if you have developed any symptoms of COVID 19. If you develop any symptoms (ie: fever, flu-like symptoms, shortness of breath, cough etc.) before then, please call [phone].  If you test positive for Covid 19 in the 2 weeks post procedure, please call and report this information to us.    If any biopsies were taken you will be contacted by phone or by letter within the next 1-3 weeks.  Please call us at [phone] if you have not heard about the biopsies in 3 weeks.   Thank you for allowing us to provide for your healthcare needs today.   SIGNATURES/CONFIDENTIALITY: You and/or your care partner have signed paperwork which will be entered into your electronic medical record.  These signatures attest to the fact that that the information above on your After Visit Summary has been reviewed and is understood.  Full responsibility of the confidentiality of this discharge information lies with you and/or   your care-partner. 

## 2019-05-05 ENCOUNTER — Telehealth: Payer: Self-pay

## 2019-05-05 NOTE — Telephone Encounter (Signed)
  Follow up Call-  Call back number 05/03/2019  Post procedure Call Back phone  # [phone]  Permission to leave phone message Yes  Some recent data might be hidden     Patient questions:  Do you have a fever, pain , or abdominal swelling? No. Pain Score  0 *  Have you tolerated food without any problems? Yes.    Have you been able to return to your normal activities? Yes.    Do you have any questions about your discharge instructions: Diet   No. Medications  No. Follow up visit  No.  Do you have questions or concerns about your Care? No.  Actions: * If pain score is 4 or above: No action needed, pain <4. 1. Have you developed a fever since your procedure? no  2.   Have you had an respiratory symptoms (SOB or cough) since your procedure? no  3.   Have you tested positive for COVID 19 since your procedure no  4.   Have you had any family members/close contacts diagnosed with the COVID 19 since your procedure?  no   If yes to any of these questions please route to Laverna Peace, RN and Jennye Boroughs, Charity fundraiser.

## 2019-05-05 NOTE — Telephone Encounter (Signed)
1st follow up call made.  NALM 

## 2019-05-13 ENCOUNTER — Other Ambulatory Visit: Payer: Self-pay

## 2019-05-13 ENCOUNTER — Encounter: Payer: 59 | Admitting: Psychology

## 2019-05-20 ENCOUNTER — Telehealth: Payer: 59 | Admitting: Psychology

## 2019-06-04 ENCOUNTER — Other Ambulatory Visit: Payer: Self-pay

## 2019-06-04 ENCOUNTER — Ambulatory Visit (INDEPENDENT_AMBULATORY_CARE_PROVIDER_SITE_OTHER): Payer: 59 | Admitting: Psychology

## 2019-06-04 ENCOUNTER — Ambulatory Visit: Payer: 59 | Admitting: Psychology

## 2019-06-04 ENCOUNTER — Encounter: Payer: Self-pay | Admitting: Psychology

## 2019-06-04 DIAGNOSIS — R4189 Other symptoms and signs involving cognitive functions and awareness: Secondary | ICD-10-CM

## 2019-06-04 DIAGNOSIS — H832X3 Labyrinthine dysfunction, bilateral: Secondary | ICD-10-CM

## 2019-06-04 DIAGNOSIS — F411 Generalized anxiety disorder: Secondary | ICD-10-CM

## 2019-06-04 NOTE — Progress Notes (Signed)
   Psychometrician Note   Cognitive testing was administered to Vonita Moss by Wallace Keller, B.S. (psychometrist) under the supervision of Dr. Newman Nickels, Ph.D., licensed psychologist. Ms. Jaquess did not appear overtly distressed by the testing session per behavioral observation or responses across self-report questionnaires. Dr. Newman Nickels, Ph.D. checked in with Ms. Lemke as needed to manage any distress related to testing procedures (if applicable). Rest breaks were offered.    The battery of tests administered was selected by Dr. Newman Nickels, Ph.D. with consideration to Ms. Pettibone's current level of functioning, the nature of her symptoms, emotional and behavioral responses during interview, level of literacy, observed level of motivation/effort, and the nature of the referral question. This battery was communicated to the psychometrist. Communication between Dr. Newman Nickels, Ph.D. and the psychometrist was ongoing throughout the evaluation and Dr. Newman Nickels, Ph.D. was immediately accessible at all times. Dr. Newman Nickels, Ph.D. provided supervision to the psychometrist on the date of this service to the extent necessary to assure the quality of all services provided.    Morissa Obeirne will return within approximately 1-2 weeks for an interactive feedback session with Dr. Milbert Coulter at which time her test performances, clinical impressions, and treatment recommendations will be reviewed in detail. Ms. Auker understands she can contact our office should she require our assistance before this time.  A total of 115 minutes of billable time were spent face-to-face with Ms. Cleckler by the psychometrist. This includes both test administration and scoring time. Billing for these services is reflected in the clinical report generated by Dr. Newman Nickels, Ph.D..  This note reflects time spent with the psychometrician and does not include test scores or any clinical  interpretations made by Dr. Milbert Coulter. The full report will follow in a separate note.

## 2019-06-04 NOTE — Progress Notes (Signed)
NEUROPSYCHOLOGICAL EVALUATION [place] Department of Neurology  Reason for Referral:   Rhonda Harmon is a 54 y.o. right-handed Caucasian female referred by Patrcia Dolly, M.D., to characterize her current cognitive functioning and assist with diagnostic clarity and treatment planning in the context of subjective cognitive dysfunction, ongoing vestibular dysfunction, and a history of headaches.  Assessment and Plan:   Clinical Impression(s): Rhonda Harmon's pattern of performance is suggestive of neuropsychological functioning within normal limits. A relative weakness (i.e., below average range) was exhibited across encoding (i.e., learning) aspects of a visual memory test. However, this was likely caused by poor performance across the initial learning trial. She showed dramatic improvement over the subsequent learning trials and her learning curve was in the exceptionally high normative range. She also retained this information well. Thus, this isolated score likely reflects variability across a battery of tests, a brief attentional lapse, or a brief period of feeling overwhelmed by the amount of information presented to her at once and is not cause for concern. Performance across all other cognitive tests was consistently in the average to exceptionally high normative ranges. Rhonda Harmon denied difficulties completing instrumental activities of daily living (ADLs) independently.  Factors which can create and maintain cognitive inefficiencies in day-to-day life include ongoing symptoms of anxiety, the presence of various psychosocial stressors, her history of bilateral peripheral vestibular dysfunction, headache symptoms, and medication side effects. These symptoms commonly influence day-to-day processing speed and attention, which can lead to observed inefficiencies across a variety of cognitive domains, including memory. More specific to memory, despite a relative  weakness learning visual information, Rhonda Harmon was able to learn novel verbal information efficiently and retained both verbal and visual information after lengthy delays. As such, there is no concern surrounding an ongoing memory deficit.   Recommendations: Rhonda Harmon expressed mild symptoms of anxiety occurring during the past 1-2 weeks. It is possible that the results of this evaluation may alleviate some of these symptoms. A combination of medication and psychotherapy has been shown to be most effective at treating symptoms of anxiety. Rhonda Harmon is encouraged to speak with her prescribing physician regarding medication adjustments to optimally manage these symptoms. She is also encouraged to consider engaging in short-term psychotherapy to address symptoms of psychiatric distress. This would be especially helpful at developing coping strategies for dealing with stress. She would benefit from an active and collaborative therapeutic environment, rather than one purely supportive in nature. Recommended treatment modalities include Cognitive Behavioral Therapy (CBT) or Acceptance and Commitment Therapy (ACT).  Rhonda Harmon described largely non-restful sleep. She reported a chronic history of tonsil swelling (without associated pain symptoms), commonly impacting her ability to breath effectively throughout the night. She should discuss the pros and cons of a laboratory sleep study with Dr. Karel Jarvis to rule out obstructive sleep apnea and/or the need for CPAP intervention. It is also likely that her caffeine consumption (6-8 cups of coffee/tea per day) has a negative effect on the overall quality of her sleep.   Rhonda Harmon is encouraged to attend to lifestyle factors for brain health (e.g., regular physical exercise, good nutrition habits, regular participation in cognitively-stimulating activities, and general stress management techniques), which are likely to have benefits for both emotional adjustment and  cognition. In fact, in addition to promoting good general health, regular exercise incorporating aerobic activities (e.g., brisk walking, jogging, cycling, etc.) has been demonstrated to be a very effective treatment for depression and stress, with similar efficacy rates to  both antidepressant medication and psychotherapy.  If interested, there are some activities which have therapeutic value and can be useful in keeping her cognitively stimulated. For suggestions, Rhonda Harmon is encouraged to go to the following website: https://www.[facility].org/get-to-know-barrow/centers-programs/neurorehabilitation-center/neuro-rehab-apps-and-games/ which has options, categorized by level of difficulty. It should be noted that these activities should not be viewed as a substitute for therapy.  When learning new information, she would benefit from information being broken up into small, manageable pieces. She may also find it helpful to articulate the material in her own words and in a context to promote encoding at the onset of a new task. This material may need to be repeated multiple times to promote encoding.  Memory can be improved using internal strategies such as rehearsal, repetition, chunking, mnemonics, association, and imagery. External strategies such as written notes in a consistently used memory journal, visual and nonverbal auditory cues such as a calendar on the refrigerator or appointments with alarm, such as on a cell phone, can also help maximize recall. Visual learning can sometimes be improved by verbalizing information necessary to remember (i.e., describing aspects of an object out loud or in her mind).   Day-to-day problems with processing speed can be improved via:   -Ensuring that she is alerted when essential material or instructions are being presented   -Adjusting the speed at which new information is presented   -Allowing for more time in comprehending, processing, and responding in  conversation  To address day-to-day problems with fluctuating attention, she may wish to consider:   -Avoiding external distractions when needing to concentrate   -Limiting exposure to fast paced environments with multiple sensory demands   -Writing down complicated information and using checklists   -Attempting and completing one task at a time (i.e., no multi-tasking)   -Verbalizing aloud each step of a task to maintain focus   -Reducing the amount of information considered at one time  Review of Records:   Rhonda Harmon was seen by [facility] Patrcia Dolly, M.D.) on 01/12/2019 for follow-up of headache symptoms and vertigo. Headaches were suggestive of tension-type headaches. She had been on nortriptyline 50mg  qhs since 2013 with infrequent headaches; however, during her previous visit, she requested to switch to a different medication due to the presence of side effects. She reported that a low dose of Topiramate 25mg  qhs helped her a lot (including her feeling clearer) but had significant GI issues with diarrhea the next day. She had tried taking it with Prilosec with no improvement. She was placed back on nortriptyline 50mg  qhs and has headaches 1-2 times a month. She has been having dizziness since at least 2009, described as a constant unsteady sensation daily where she feels like she is getting off a cruise ship. No associated hearing loss or tinnitus. She saw vestibular therapy while living in and was diagnosed with bilateral peripheral vestibular dysfunction. Significant improvement of symptoms was noted with vestibular therapy. During the current visit with Dr. , Rhonda Harmon reported increased concerns surrounding shirt-term memory over the past 6 months. She also noted a diminished ability to multi-task. ADLs were described as intact. Performance on a brief cognitive screening instrument (SLUMS) was 28/30. Ultimately, Rhonda Harmon was referred for a comprehensive  neuropsychological evaluation to characterize her cognitive abilities and to assist with diagnostic clarity and treatment planning.   Brain MRI on 08/01/2015 revealed an asymmetric enlargement (8 x 15 mm) of the right sylvian fissure superiorly, possible representing an arachnoid cyst, as well as some  white matter intensities in the right parietal lobe. Brain MRI on 06/18/2016 was stable.   Past Medical History:  Diagnosis Date  . 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 Asthma, mild intermittent 09/14/2007   Overview:  IMO Update  . Cervico-occipital neuralgia 05/06/2011   Overview:  Pt sees [facility] Neurology  Last Assessment & Plan:  At this point she is interested in having something for a muscle relaxant to use p.r.n. at home.  I chose diazepam; she has significant anxiety and stress related to her husband's recent cancer diagnosis.  She will also return for EFT.  Marland Kitchen Episodic tension-type headache, not intractable 10/20/2015  . Hyperlipidemia   . Seasonal allergies   . Vestibular disequilibrium 10/20/2015    Past Surgical History:  Procedure Laterality Date  . EXPLORATORY LAPAROTOMY    . HAND SURGERY    . TMJ ARTHROPLASTY      Current Outpatient Medications:  .  atorvastatin (LIPITOR) 20 MG tablet, Take 20 mg by mouth daily., Disp: , Rfl:  .  b complex vitamins tablet, Take 1 tablet by mouth daily., Disp: , Rfl:  .  cetirizine (ZYRTEC) 10 MG tablet, Take 10 mg by mouth daily., Disp: , Rfl:  .  diazepam (VALIUM) 2 MG tablet, Take by mouth., Disp: , Rfl:  .  esomeprazole (NEXIUM) 20 MG capsule, Take 1 capsule (20 mg total) by mouth daily at 12 noon., Disp: 30 capsule, Rfl: 2 .  Multiple Vitamin (MULTIVITAMIN) tablet, Take 1 tablet by mouth daily., Disp: , Rfl:  .  nortriptyline (PAMELOR) 50 MG capsule, Take 1 capsule (50 mg total) by mouth at bedtime., Disp: 90 capsule, Rfl: 3 .  VITAMIN D PO, Take by mouth daily. With K added, Disp: , Rfl:   Current Facility-Administered Medications:  .  0.9 %   sodium chloride infusion, 500 mL, Intravenous, Once, Nandigam, Eleonore Chiquito, MD  Clinical Interview:   Cognitive Symptoms: Decreased short-term memory: Largely denied. She did acknowledge some trouble "getting names right" (e.g., mixing up the names of foster dogs), as well as feeling that she needs to rely upon written notes more frequently. These weaknesses were described as stable and potentially related to attentional issues and/or ongoing psychosocial stressors.  Decreased long-term memory: Denied. Decreased attention/concentration: Endorsed. She described some trouble maintaining her focus and ease of distractibility. This was attributed to her chronic vestibular dysfunction and always needing to designate part of her attentional resources to address balance concerns (e.g., being able to sit in a chair without moving around).  Reduced processing speed: Endorsed. Symptoms were said to be very mild.  Difficulties with executive functions: Endorsed. Primary weaknesses were said to surround a diminished ability to multi-task. She denied difficulties with organization or complex planning, as well as impulsivity. Personality changes were also denied.  Difficulties with emotion regulation: Denied. Difficulties with receptive language: Endorsed. However, these were largely attributed to deficits in attention. Difficulties with word finding: Endorsed. She described experiencing a "little pause" when trying to find the words she wishes to use or figure out where she is going with the conversation.  Decreased visuoperceptual ability: Denied.  Difficulties completing ADLs: Denied.  Additional Medical History: History of traumatic brain injury/concussion: Denied. History of stroke: Denied. History of seizure activity: Denied. History of known exposure to toxins: Denied. Symptoms of chronic pain: Denied. Experience of frequent headaches/migraines: Endorsed. She has a history of significant tension-based  headache symptoms. She reported that these symptoms are generally managed well via nortriptyline. While she acknowledged some  mild cognitive side effects (processing speed/attention) surrounding using this medication, she stated that these are outweighed by the positive benefits in headache management and balance improvement. Frequent instances of dizziness/vertigo: Endorsed. As described earlier, Ms. Setzer was previously diagnosed with bilateral peripheral vestibular dysfunction. These symptoms were said to more or less be a constant presence.   Sensory changes: She wears contact lenses with positive effect. Other sensory changes/difficulties (e.g., hearing, taste, or smell) were denied.  Balance/coordination difficulties: Endorsed. As described above, ongoing vestibular dysfunction creates significant balance instability at times. She reported being able to catch herself when falling effectively and denied a history of falls resulting in any serious injuries. Previous vestibular therapy was said to be "life changing" and she stated that she goes back for refresher courses every few years.  Other motor difficulties: Denied.  Sleep History: Estimated hours obtained each night: 5-8 hours.  Difficulties falling asleep: Denied. Difficulties staying asleep: Denied. Feels rested and refreshed upon awakening: "Not so much." However, she denied having a problem getting up in the morning and starting her day.  History of snoring: Endorsed. History of waking up gasping for air: Endorsed. Witnessed breath cessation while asleep: Denied. She reported a chronic history of tonsil swelling (without associated pain symptoms), commonly impacting her ability to breath effectively throughout the night.   History of vivid dreaming: Denied. Excessive movement while asleep: Denied. Instances of acting out her dreams: Denied.  Psychiatric/Behavioral Health History: Depression: Denied. She described her current mood  as "fine" and denied a history of previous mental health concerns or diagnoses. Current or remote suicidal ideation, intent, or plan was also denied.  Anxiety: Denied. Mania: Denied. Trauma History: Denied. Visual/auditory hallucinations: Denied. Delusional thoughts: Denied.  Tobacco: Denied. She reported quitting over 20 years ago. Alcohol: She reported occasional alcohol consumption and denied a history of problematic alcohol abuse or dependence.  Recreational drugs: Denied. Caffeine: She reported consuming 6-8 cups of coffee/tea spread out throughout the day.   Family History: Problem Relation Age of Onset  . Crohn's disease Sister   . Crohn's disease Sister   . Colon cancer Neg Hx   . Colon polyps Neg Hx   . Esophageal cancer Neg Hx   . Rectal cancer Neg Hx   . Stomach cancer Neg Hx   . Dementia Neg Hx    This information was confirmed by Rhonda Harmon.  Academic/Vocational History: Highest level of educational attainment: 12 years. Rhonda Harmon graduated from high school and described herself as a good (B/C) student in academic settings.  History of developmental delay: Denied. History of grade repetition: Denied. Enrollment in special education courses: Denied. History of LD/ADHD: Denied.  Employment: She currently works as a Careers adviser. 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 The latter was described as a hobby/passion.   Evaluation Results:   Behavioral Observations: Rhonda Harmon was unaccompanied, arrived to her appointment on time, and was appropriately dressed and groomed. Observed gait and station were within normal limits. Gross motor functioning appeared intact upon informal observation and no abnormal movements (e.g., tremors) were noted. Her affect was generally relaxed and positive, but did range appropriately given the subject being discussed during the clinical interview or  the task at hand during testing procedures. Spontaneous speech was fluent and word finding difficulties were not observed during the clinical interview or testing procedures. Thought processes were coherent, organized, and normal in content. Insight into  her cognitive difficulties appeared appropriate. During testing, sustained attention was appropriate. Task engagement was adequate and she persisted when challenged. Overall, Rhonda Harmon was cooperative with the clinical interview and subsequent testing procedures.   Adequacy of Effort: The validity of neuropsychological testing is limited by the extent to which the individual being tested may be assumed to have exerted adequate effort during testing. Rhonda Harmon expressed her intention to perform to the best of her abilities and exhibited adequate task engagement and persistence. Scores across stand-alone and embedded performance validity measures were within expectation. As such, the results of the current evaluation are believed to be a valid representation of Rhonda Harmon's current cognitive functioning.  Test Results: Ms. Diffee was fully oriented at the time of the current evaluation.  Intellectual abilities based upon educational and vocational attainment were estimated to be in the average range. Premorbid abilities were estimated to be within the average range based upon a single-word reading test.   Processing speed was average to above average. Basic attention was average. More complex attention (e.g., working memory) was also average. Executive functioning was average to well above average.  Assessed receptive language abilities were above average. Likewise, Ms. Kizer did not exhibit any difficulties comprehending task instructions and answered all questions asked of her appropriately. Assessed expressive language (e.g., verbal fluency and confrontation naming) was average to exceptionally high.     Assessed visuospatial/visuoconstructional  abilities were average to above average.    Learning (i.e., encoding) of novel verbal information was average to above average and learning of visual information was below average. Spontaneous delayed recall (i.e., retrieval) of previously learned information was average. Retention rates were 86% across a story learning task, 87-93% across a list learning task, and 80% across a shape learning task. Performance across recognition tasks was strong, suggesting evidence for information consolidation.   Results of emotional screening instruments suggested that recent symptoms of generalized anxiety were in the mild range, while symptoms of depression were within normal limits. A screening instrument assessing recent sleep quality suggested the presence of minimal sleep dysfunction.  Tables of Scores:   Note: This summary of test scores accompanies the interpretive report and should not be considered in isolation without reference to the appropriate sections in the text. Descriptors are based on appropriate normative data and may be adjusted based on clinical judgment. The terms "impaired" and "within normal limits (WNL)" are used when a more specific level of functioning cannot be determined.       Effort Testing:   DESCRIPTOR       ACS Word Choice: --- --- Within Expectation  CVLT-III Forced Choice Recognition: --- --- Within Expectation  BVMT-R Retention Percentage: --- --- Within Expectation       Orientation:      Raw Score Percentile   NAB Orientation, Form 1 29/29 --- ---       Intellectual Functioning:           Standard Score Percentile   Test of Premorbid Functioning: 101 53 Average       Memory:          Wechsler Memory Scale (WMS-IV):                       Raw Score (Scaled Score) Percentile     Logical Memory I 28/50 (11) 63 Average    Logical Memory II 24/50 (11) 63 Average    Logical Memory Recognition 25/30 51-75 Average  [place] Verbal Learning Test  (CVLT-III), Standard Form: Raw Score (Scaled/Standard Score) Percentile     Total Trials 1-5 60/80 (115) 84 Above Average    List B 8/16 (14) 91 Above Average    Short-Delay Free Recall 13/16 (13) 84 Above Average    Short-Delay Cued Recall 14/16 (13) 84 Above Average    Long Delay Free Recall 14/16 (13) 84 Above Average    Long Delay Cued Recall 14/16 (12) 75 Above Average      Recognition Hits 16/16 (13) 84 Above Average      False Positive Errors 0 (13) 84 Above Average       Brief Visuospatial Memory Test (BVMT-R), Form 1: Raw Score (T Score) Percentile     Total Trials 1-3 17/36 (37) 9 Below Average    Delayed Recall 8/12 (46) 34 Average    Recognition Discrimination Index 6 >16 Within Normal Limits      Recognition Hits 6/6 >16 Within Normal Limits      False Positive Errors 0 >16 Within Normal Limits        Attention/Executive Function:          Trail Making Test (TMT): Raw Score (T Score) Percentile     Part A 28 secs.,  1 error (52) 58 Average    Part B 39 secs.,  0 errors (67) 96 Well Above Average         Scaled Score Percentile   WAIS-IV Coding: 11 63 Average       NAB Attention Module, Form 1: T Score Percentile     Digits Forward 53 62 Average    Digits Backwards 50 50 Average       D-KEFS Color-Word Interference Test: Raw Score (Scaled Score) Percentile     Color Naming 23 secs. (13) 84 Above Average    Word Reading 17 secs. (13) 84 Above Average    Inhibition 47 secs. (13) 84 Above Average      Total Errors 1 error (11) 63 Average    Inhibition/Switching 45 secs. (14) 91 Above Average      Total Errors 1 error (11) 63 Average       D-KEFS Verbal Fluency Test: Raw Score (Scaled Score) Percentile     Letter Total Correct 58 (16) 98 Exceptionally High    Category Total Correct 45 (13) 84 Above Average    Category Switching Total Correct 14 (11) 63 Average    Category Switching Accuracy 13 (11) 63 Average      Total Set Loss Errors 0 (13) 84 Above  Average      Total Repetition Errors 7 (6) 9 Below Average       D-KEFS 20 Questions Test: Scaled Score Percentile     Total Weighted Achievement Score 13 84 Above Average    Initial Abstraction Score 10 50 Average       [place] Card Sorting Test [facility]): Raw Score Percentile     Categories (trials) 5 (64) >16 Within Normal Limits    Total Errors 10 75 Above Average    Perseverative Errors 5 69 Average    Non-Perseverative Errors 5 42 Average    Failure to Maintain Set 0 --- ---       Language:          NAB Language Module, Form 1: T Score Percentile     Auditory Comprehension 58 79 Above Average    Naming 31/31 (55) 69 Average       Visuospatial/Visuoconstruction:  Raw Score Percentile   Clock Drawing: 10/10 --- Within Normal Limits       NAB Spatial Module, Form 1: T Score Percentile     Figure Drawing Copy 62 88 Above Average    Figure Drawing Immediate Recall 52 58 Average        Scaled Score Percentile   WAIS-IV Matrix Reasoning: 8 25 Average  WAIS-IV Visual Puzzles: 9 37 Average       Mood and Personality:      Raw Score Percentile   Beck Depression Inventory - II: 11 --- Within Normal Limits  PROMIS Anxiety Questionnaire: 19 --- Mild       Additional Questionnaires:      Raw Score Percentile   PROMIS Sleep Disturbance Questionnaire: 19 --- None to Slight   Informed Consent and Coding/Compliance:   Ms. Fredricka BonineConnor was provided with a verbal description of the nature and purpose of the present neuropsychological evaluation. Also reviewed were the foreseeable risks and/or discomforts and benefits of the procedure, limits of confidentiality, and mandatory reporting requirements of this provider. The patient was given the opportunity to ask questions and receive answers about the evaluation. Oral consent to participate was provided by the patient.   This evaluation was conducted by Newman NickelsZachary C. Chardonay Scritchfield, Ph.D., licensed clinical neuropsychologist. Ms. Fredricka BonineConnor completed a  comprehensive clinical interview with Dr. Milbert CoulterMerz, billed as one unit [phone], and 115 minutes of cognitive testing and scoring, billed as one unit [phone] and three additional units [id number]. Psychometrist Wallace Kellerana Chamberlain, B.S., assisted Dr. Milbert CoulterMerz with test administration and scoring procedures. As a separate and discrete service, Dr. Milbert CoulterMerz spent a total of 180 minutes in interpretation and report writing billed as one unit [phone] and two units [id number].

## 2019-06-21 ENCOUNTER — Telehealth (INDEPENDENT_AMBULATORY_CARE_PROVIDER_SITE_OTHER): Payer: 59 | Admitting: Psychology

## 2019-06-21 ENCOUNTER — Other Ambulatory Visit: Payer: Self-pay

## 2019-06-21 DIAGNOSIS — H832X3 Labyrinthine dysfunction, bilateral: Secondary | ICD-10-CM

## 2019-06-21 DIAGNOSIS — R4189 Other symptoms and signs involving cognitive functions and awareness: Secondary | ICD-10-CM

## 2019-06-21 NOTE — Progress Notes (Signed)
   Neuropsychology Feedback Session Rhonda Harmon. [facility] [place] Department of Neurology  Reason for Referral:   Rhonda Harmon [facility] a 54 y.o. right-handed Caucasian female referred by Patrcia Dolly, M.D.,to characterize hercurrent cognitive functioning and assist with diagnostic clarity and treatment planning in the context of subjective cognitive dysfunction, ongoing vestibular dysfunction, and a history of headaches.  Feedback:   [name] completed a comprehensive neuropsychological evaluation on 06/04/2019. Please refer to that encounter for the full report and recommendations. Briefly, results suggested neuropsychological functioning within normal limits. A relative weakness (i.e., below average range) was exhibited across encoding (i.e., learning) aspects of a visual memory test. However, this was likely caused by poor performance across the initial learning trial. She showed dramatic improvement over the subsequent learning trials and her learning curve was in the exceptionally high normative range. She also retained this information well. Thus, this isolated score likely reflects variability across a battery of tests, a brief attentional lapse, or a brief period of feeling overwhelmed by the amount of information presented to her at once and is not cause for concern. Performance across all other cognitive tests was consistently in the average to exceptionally high normative ranges.   [name] was unaccompanied. Content of the current virtual session focused on the results of her neuropsychological evaluation. [name] was given the opportunity to ask questions and her questions were answered. She was encouraged to reach out should additional questions arise.     Less than 16 minutes were spent conducting the current feedback session with [name].

## 2019-08-09 ENCOUNTER — Encounter: Payer: Self-pay | Admitting: Neurology

## 2019-08-09 ENCOUNTER — Other Ambulatory Visit: Payer: Self-pay

## 2019-08-09 ENCOUNTER — Ambulatory Visit: Payer: 59 | Admitting: Neurology

## 2019-08-09 DIAGNOSIS — G44219 Episodic tension-type headache, not intractable: Secondary | ICD-10-CM

## 2019-08-09 MED ORDER — NORTRIPTYLINE HCL 50 MG PO CAPS: 50.0000 mg | ORAL_CAPSULE | Freq: Every day | ORAL | 3 refills | Status: DC

## 2019-08-09 NOTE — Patient Instructions (Signed)
Great seeing you! Continue nortriptyline 50mg  every night. Follow-up in 1 year, call for any changes

## 2019-08-09 NOTE — Progress Notes (Signed)
NEUROLOGY FOLLOW UP OFFICE NOTE  Rhonda Harmon [id number] 1965/05/28  HISTORY OF PRESENT ILLNESS: I had the pleasure of seeing Rhonda Harmon in follow-up in the neurology clinic on 08/09/2019.  The patient was last seen 7 months ago for headaches, vertigo, and memory changes.  Since her last visit, she underwent Neuropsychological testing in 05/2019 which was within normal limits. Performance across majority of other cognitive tests was consistently in the average to exceptionally high normative ranges. Findings were discussed with the patient, she agrees that external factors such as her work stress has played a part, she feels better about her memory and now sets limits because she knows she works hard. She has been doing well with regards to the headaches. On her last visit, she tried to switch back to Topiramate but again did not tolerate GI side effects. She has been back to taking nortriptyline 50mg  qhs and reports headaches are under control. No side effects. Sleep is good. She denies any dizziness, focal numbness/tingling/weakness, no falls.   History on Initial Assessment 10/20/2015: This is a pleasant 54 yo [name] with a history of headaches and vertigo. Records from her previous neurologist in [place] were reviewed. She has been having dizziness since at least 2009, described as a constant unsteady sensation daily where she feels like she is getting off a cruise ship. No associated hearing loss or tinnitus. MRI brain with and without contrast done 2009 in 2010 reported as normal. She had seen vestibular therapy in [place] and was diagnosed with bilateral peripheral vestibular dysfunction. She had significant improvement of symptoms with vestibular therapy. She started having occipital neuralgia in 2013 and was started on Nortriptyline. She reports this helps with both the headaches and dizziness. She states the occipital neuralgia is better, she has pressure-like headaches over the bilateral  temporal regions occurring around once a week, no associated nausea, vomiting, photo/phonophobia, visual obscurations. She occasionally wakes up with a headache. She takes prn Excedrin with good effect. She is taking nortriptyline 50mg  qhs with no side effects, sleep is good. She denies any diplopia, dysarthria/dysphagia, focal numbness/tingling/weakness. She has some neck pain. No bowel/bladder dysfunction. Memory is good, but she sometimes has a hard time focusing due to her balance issues. She fell 6 months ago while walking her dog. She denies any history of head injuries. Before she left [place], she reported worsening headaches and dizziness and had an MRI with [facility] done 5/79/17 without contrast which I personally reviewed, there was an 8 x 55mm increased CSF signal with asymmetric enlargement of the right sylvian fissure superiorly which may represent an arachnoid cyst. There is a 36mm hyperintensity in the right parietal white matter. No acute changes.   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 Stable minimal chronic microvascular changes.   Prior Preventatives: Topiramate, Gabapentin, nortriptyline   PAST MEDICAL HISTORY: Past Medical History:  Diagnosis Date  . 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 Asthma, mild intermittent 09/14/2007   Overview:  IMO Update  . Cervico-occipital neuralgia 05/06/2011   Overview:  Pt sees [facility] Neurology  Last Assessment & Plan:  At this point she is interested in having something for a muscle relaxant to use p.r.n. at home.  I chose diazepam; she has significant anxiety and stress related to her husband's recent cancer diagnosis.  She will also return for EFT.  07/04/2011  Episodic tension-type headache, not intractable 10/20/2015  . Hyperlipidemia   . Seasonal allergies   . Vestibular disequilibrium  10/20/2015   MEDICATIONS: Outpatient Encounter Medications as of 01/12/2019  Medication Sig Note  . cetirizine (ZYRTEC) 10 MG tablet Take 10 mg by mouth daily.   . diazepam (VALIUM) 2 MG tablet Take by mouth. 10/20/2015: Received from: [facility] Received Sig: Take 1 Tab (2 mg total) by mouth every 8 hours as needed for Sleep  . nortriptyline (PAMELOR) 50 MG capsule Take 1 capsule (50 mg total) by mouth at bedtime.   .     .     .     .      No facility-administered encounter medications on file as of 01/12/2019.   ALLERGIES: Allergies  Allergen Reactions  . Other     Silk sutures   . Penicillins Rash    FAMILY HISTORY: Family History  Problem Relation Age of Onset  . Crohn's disease Sister   . Crohn's disease Sister   . Colon cancer Neg Hx   . Colon polyps Neg Hx   . Esophageal cancer Neg Hx   . Rectal cancer Neg Hx   . Stomach cancer Neg Hx   . Dementia Neg Hx     SOCIAL HISTORY: Social History   Socioeconomic History  . Marital status: Married    Spouse name: Not on file  . Number of children: Not on file  . Years of education: 30  . Highest education level: High school graduate  Occupational History  . Not on file  Tobacco Use  . Smoking status: Former Smoker    Quit date: 10/19/2000    Years since quitting: 18.8  . Smokeless tobacco: Never Used  Substance and Sexual Activity  . Alcohol use: Yes    Comment: occ   . Drug use: No  . Sexual activity: Not on file  Other Topics Concern  . Not on file  Social History Narrative   Right handed      Completed HS      Lives in two story home   Social Determinants of Health   Financial Resource Strain:   . Difficulty of Paying Living Expenses:   Food Insecurity:   . Worried About Charity fundraiser in the Last Year:   . Arboriculturist in the Last Year:   Transportation Needs:   . Film/video editor (Medical):   Marland Kitchen Lack of Transportation (Non-Medical):   Physical Activity:   . Days of Exercise  per Week:   . Minutes of Exercise per Session:   Stress:   . Feeling of Stress :   Social Connections:   . Frequency of Communication with Friends and Family:   . Frequency of Social Gatherings with Friends and Family:   . Attends Religious Services:   . Active Member of Clubs or Organizations:   . Attends Archivist Meetings:   Marland Kitchen Marital Status:   Intimate Partner Violence:   . Fear of Current or Ex-Partner:   . Emotionally Abused:   Marland Kitchen Physically Abused:   . Sexually Abused:     REVIEW OF SYSTEMS: Constitutional: No fevers, chills, or sweats, no generalized fatigue, change in appetite Eyes: No visual changes, double vision, eye pain Ear, nose and throat: No hearing loss, ear pain, nasal congestion, sore throat Cardiovascular: No chest pain, palpitations Respiratory:  No shortness of breath at rest or with exertion, wheezes GastrointestinaI: No nausea, vomiting, diarrhea, abdominal pain,  fecal incontinence Genitourinary:  No dysuria, urinary retention or frequency Musculoskeletal:  No neck pain, back pain Integumentary: No rash, pruritus, skin lesions Neurological: as above Psychiatric: No depression, insomnia, anxiety Endocrine: No palpitations, fatigue, diaphoresis, mood swings, change in appetite, change in weight, increased thirst Hematologic/Lymphatic:  No anemia, purpura, petechiae. Allergic/Immunologic: no itchy/runny eyes, nasal congestion, recent allergic reactions, rashes  PHYSICAL EXAM: Vitals:   08/09/19 0825  BP: 135/86  Pulse: 100  Resp: 20  SpO2: 99%   General: No acute distress Head:  Normocephalic/atraumatic Skin/Extremities: No rash, no edema Neurological Exam: alert and oriented to person, place, and time. No aphasia or dysarthria. Fund of knowledge is appropriate.  Recent and remote memory are intact.  Attention and concentration are normal. Cranial nerves: Pupils equal, round, reactive to light.  Extraocular movements intact with no  nystagmus. Visual fields full. No facial asymmetry.  Motor: Bulk and tone normal, muscle strength 5/5 throughout with no pronator drift. Finger to nose testing intact.  Gait narrow-based and steady, able to tandem walk adequately.   IMPRESSION: This is a pleasant 54 yo [name] with a history of occipital neuralgia and bilateral peripheral vestibular dysfunction with chronic sensation of dysequilibrium. MRI brain done March 2018 showed stable 8x29mm arachnoid cyst, she is asymptomatic from this. Headaches under control on nortriptyline 50mg  qhs, refills sent. We discussed normal Neuropsychological testing results which have reassured her. Follow-up in 1 year, she knows to call for any changes.   Thank you for allowing me to participate in her care.  Please do not hesitate to call for any questions or concerns.   , M.D.   CC: Dr. Patrcia Dolly

## 2020-04-06 IMAGING — MG DIGITAL SCREENING BILAT W/ TOMO W/ CAD
8 series · 9 of 24 positions shown · non-contrast
Comparison: Previous exam(s).

CLINICAL DATA: Screening.

EXAM:
DIGITAL SCREENING BILATERAL MAMMOGRAM WITH TOMO AND CAD

[L CC synth-2D]
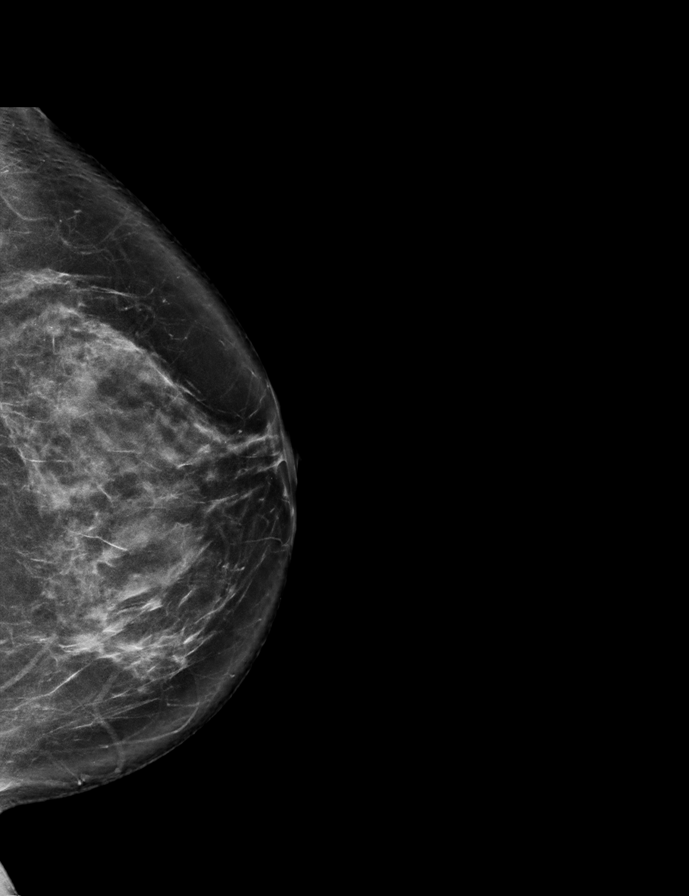

[R MLO synth-2D]
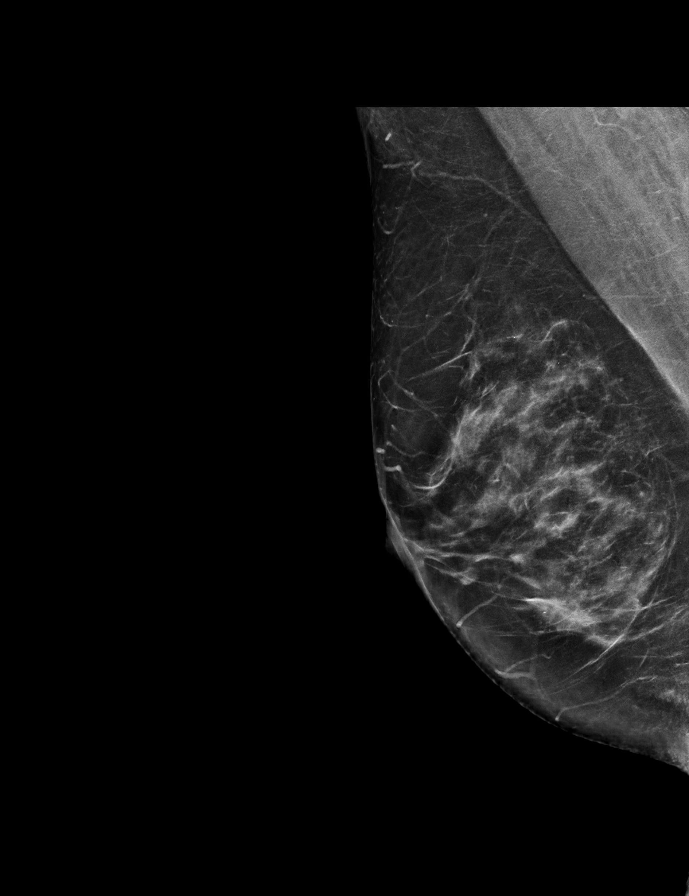

[R CC synth-2D]
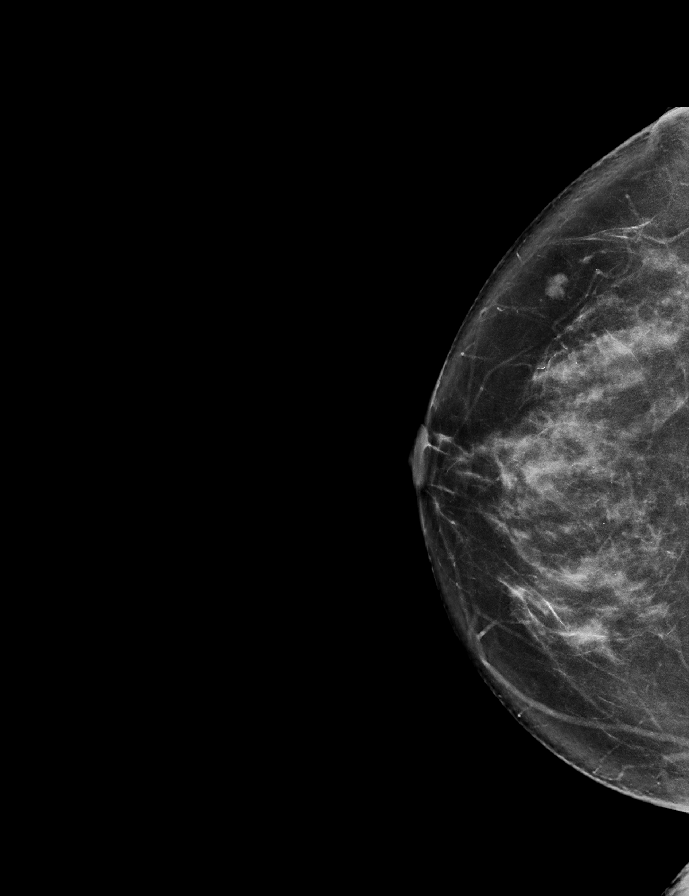

[L MLO synth-2D]
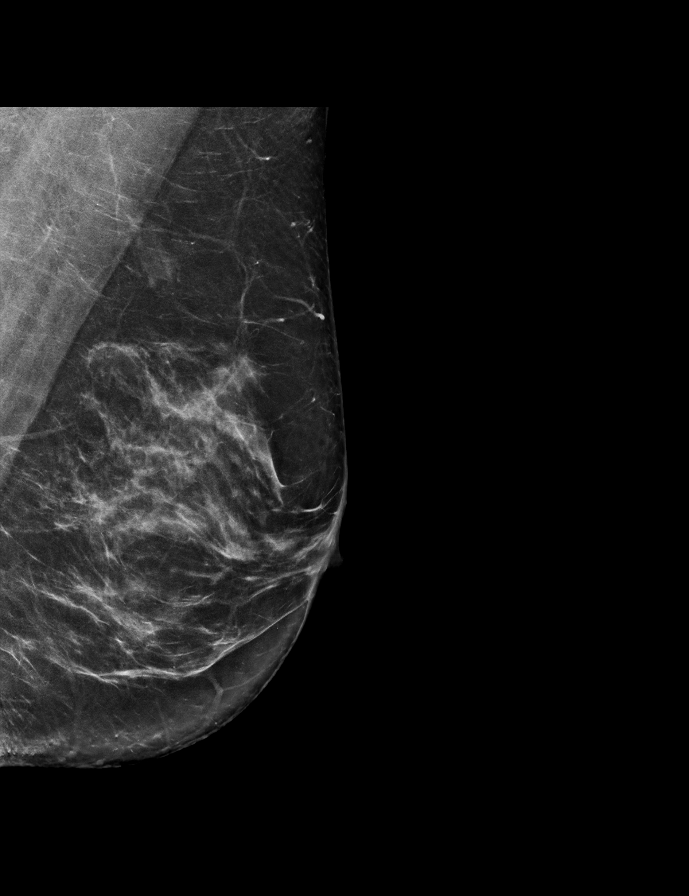

[R CC tomo · 2 of 70 frames shown]
[frame 23/70]
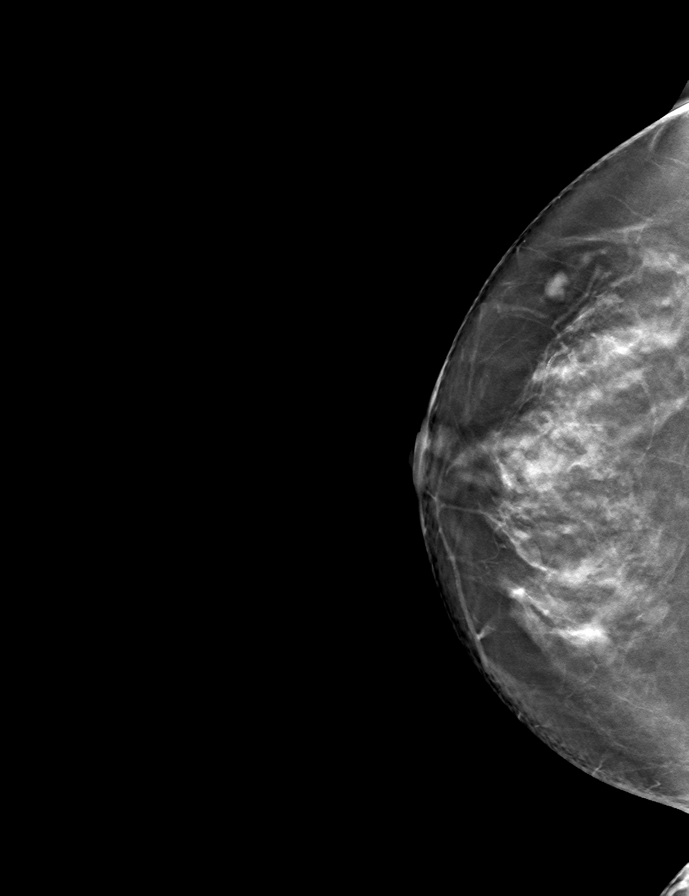
[frame 35/70]
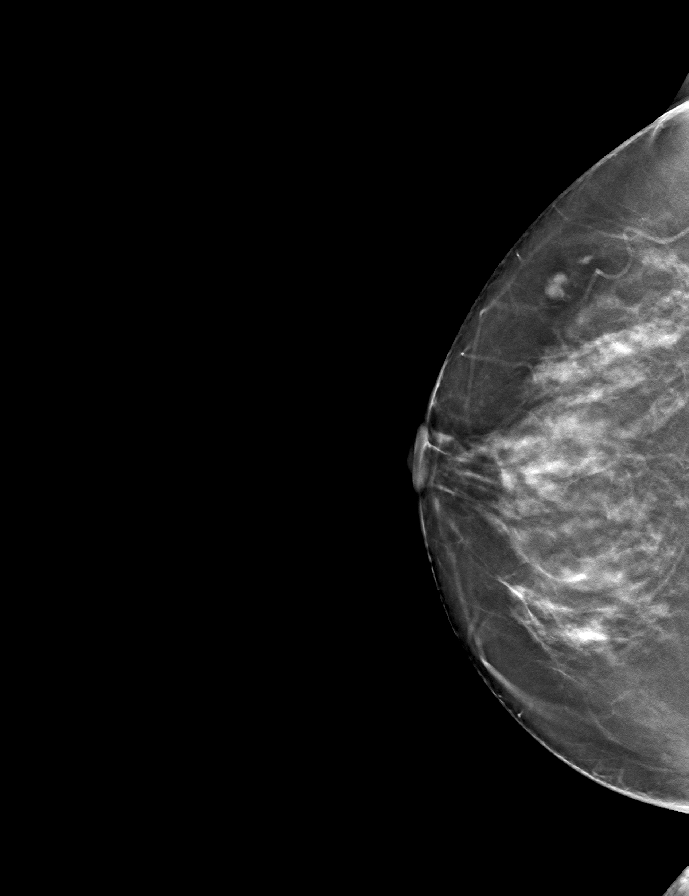

[L MLO tomo · tomo slice 39/78.0]
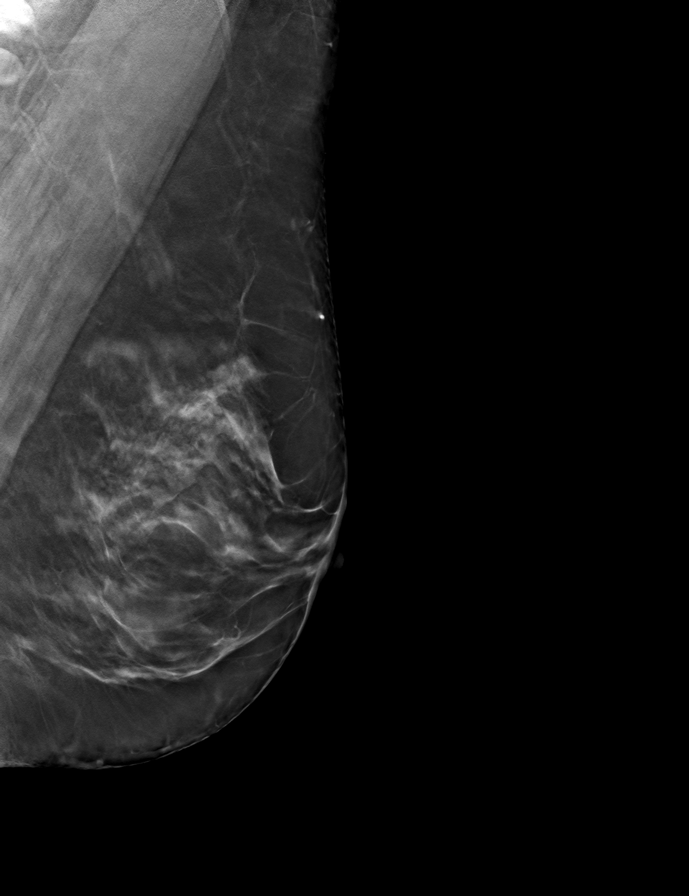

[R MLO tomo · tomo slice 37/74.0]
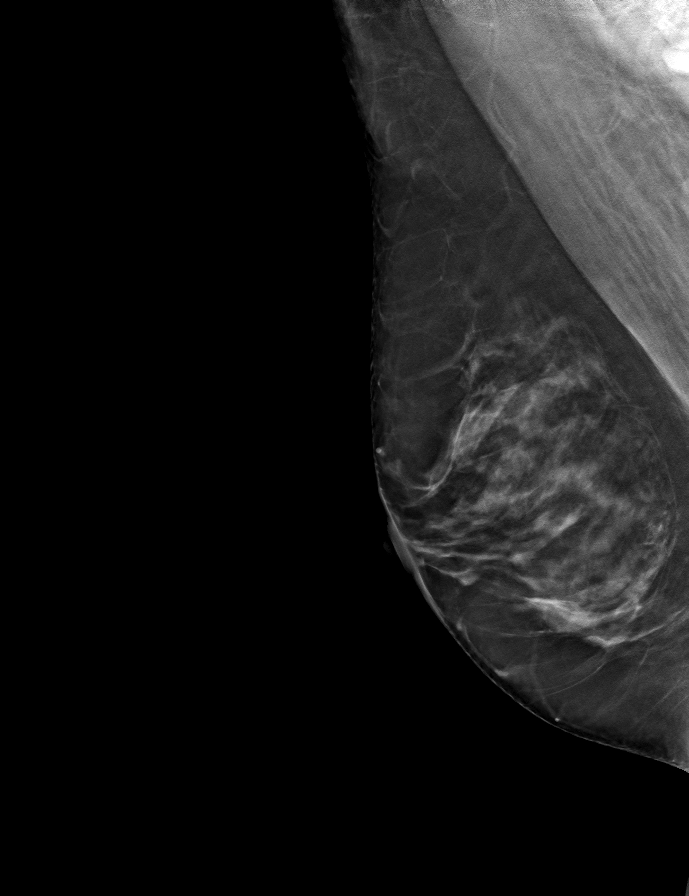

[L CC tomo · tomo slice 37/73.0]
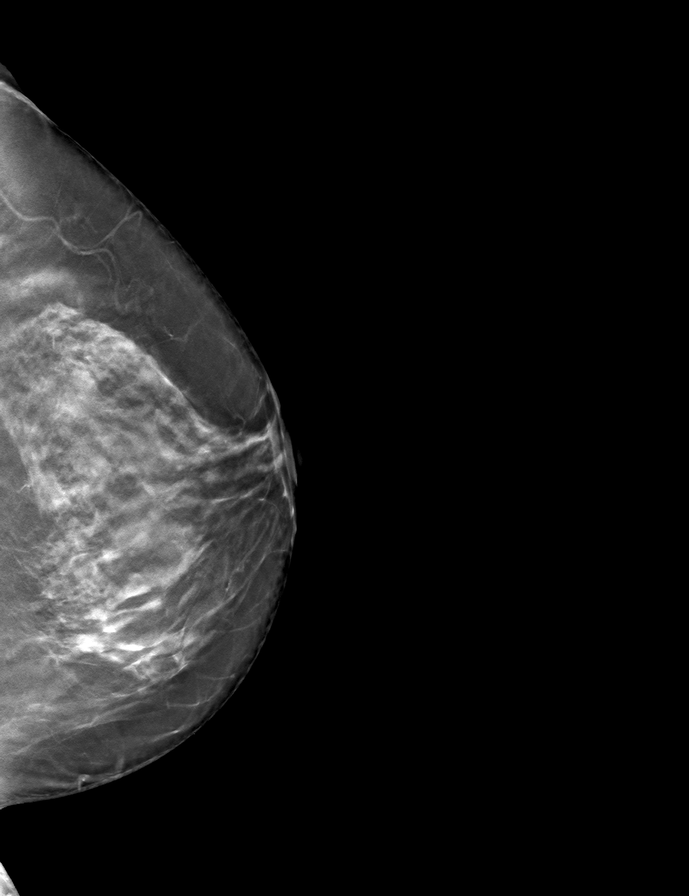

[9 of 24 positions shown; findings below may reference images not displayed]

ACR Breast Density Category c: The breast tissue is heterogeneously
dense, which may obscure small masses.
FINDINGS: There are no findings suspicious for malignancy. Images were
processed with CAD.
IMPRESSION: No mammographic evidence of malignancy. A result letter of this
screening mammogram will be mailed directly to the patient.

RECOMMENDATION:
Screening mammogram in one year. (Code:FT-U-LHB)

BI-RADS CATEGORY  1: Negative.

## 2020-04-09 IMAGING — US US ABDOMEN LIMITED
1 series · 14 of 25 positions shown · non-contrast
Comparison: None.

CLINICAL DATA: 53-year-old female with elevated LFTs.

EXAM:
ULTRASOUND ABDOMEN LIMITED RIGHT UPPER QUADRANT

[Series 1: us abdomen limited · 0.17mm/px · 14 of 40 slices shown]
[im 1/40]
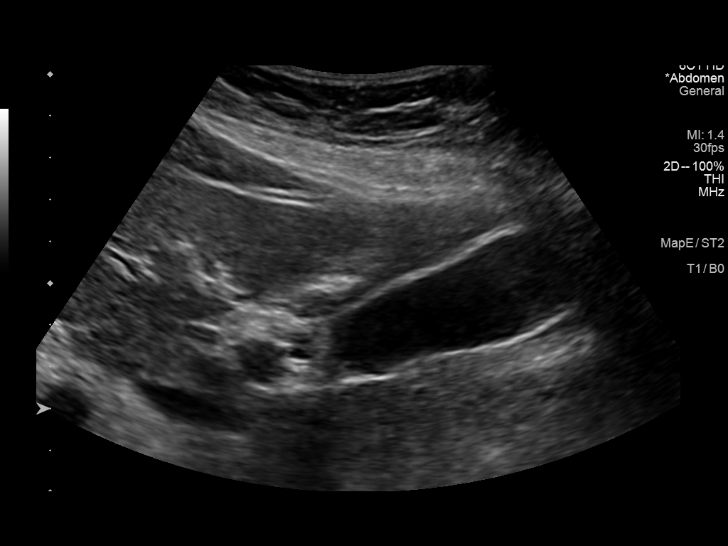
[im 4/40]
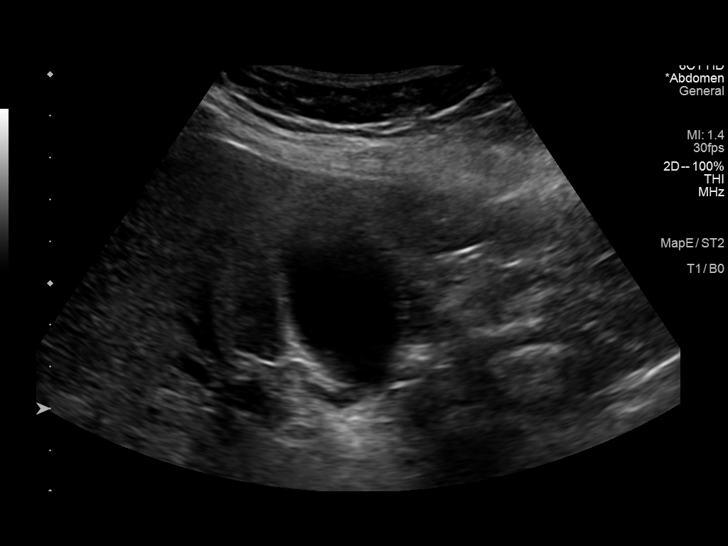
[im 7/40]
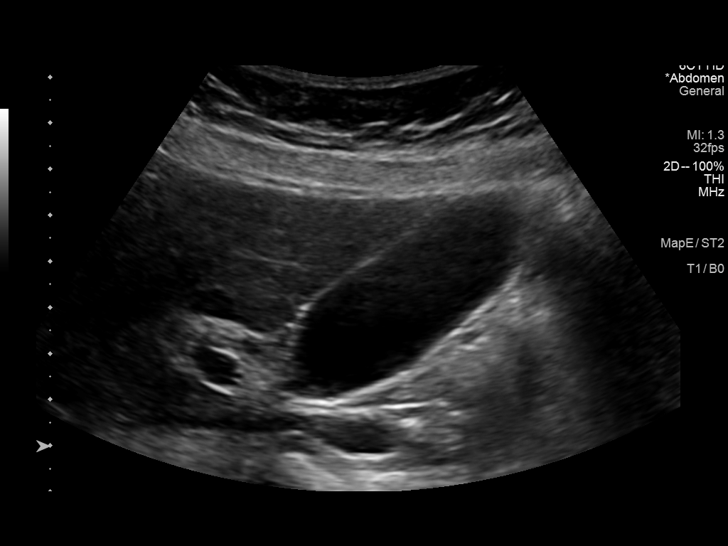
[im 10/40]
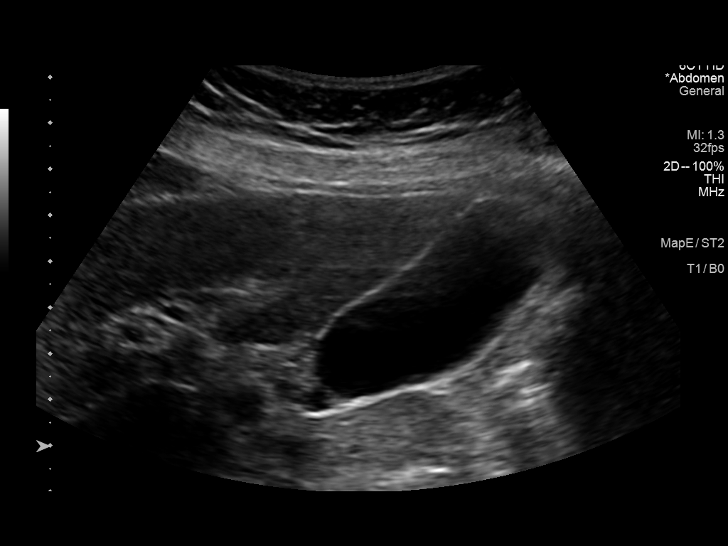
[im 14/40]
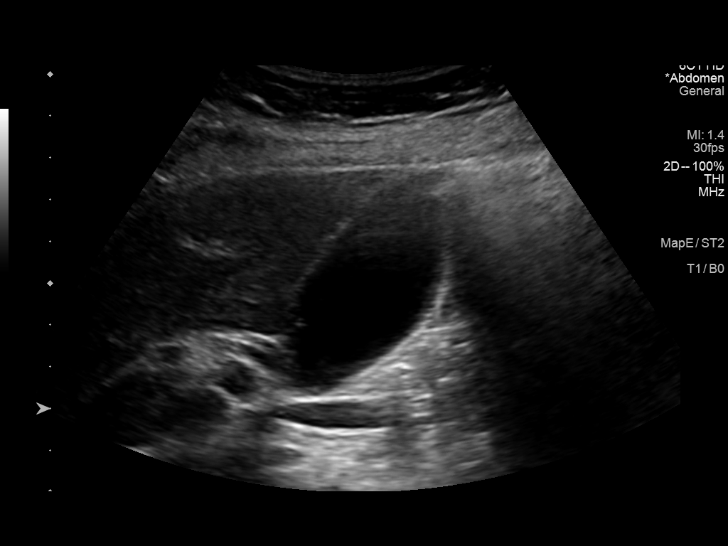
[im 15/40]
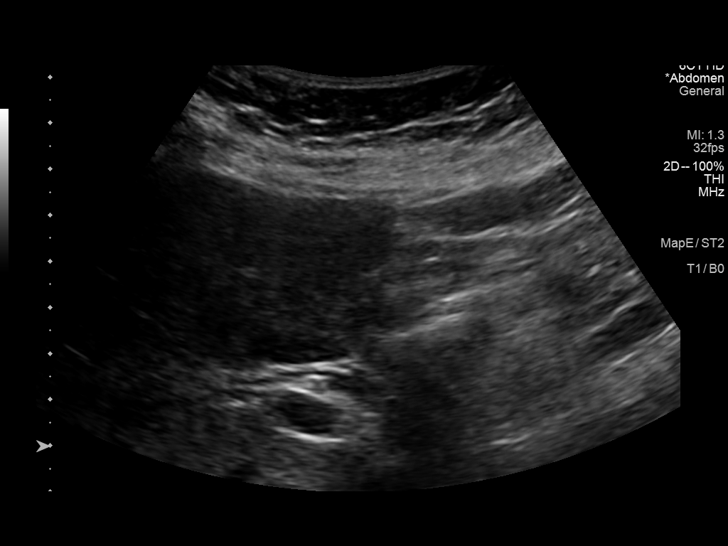
[im 18/40]
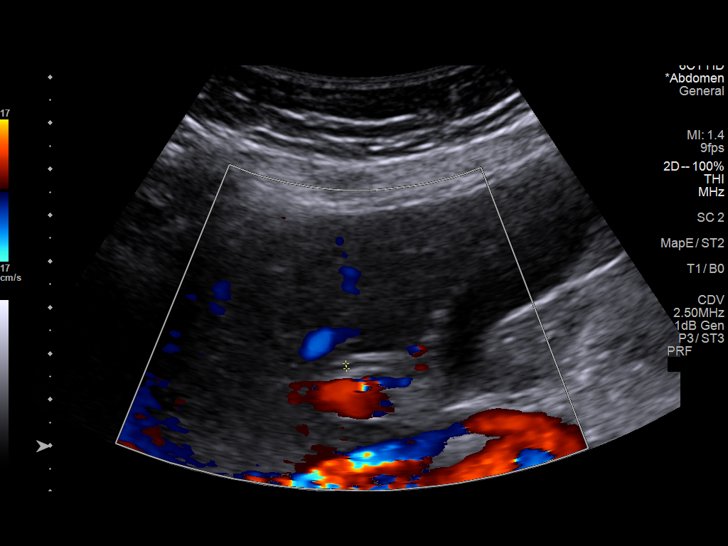
[im 22/40]
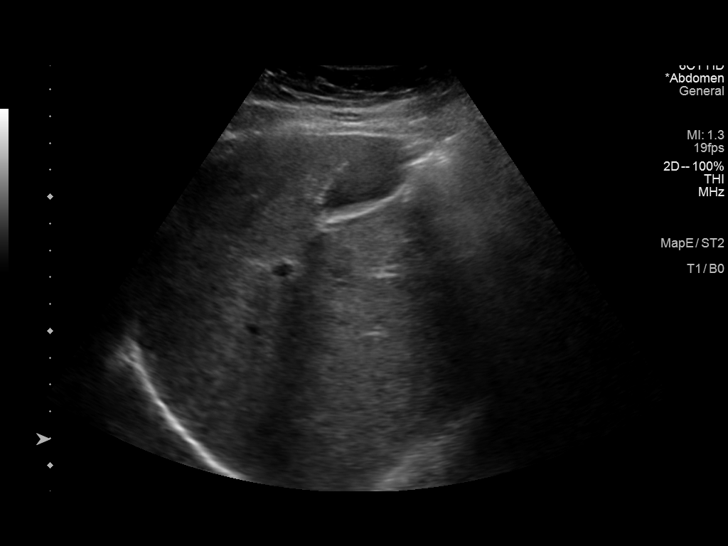
[im 25/40]
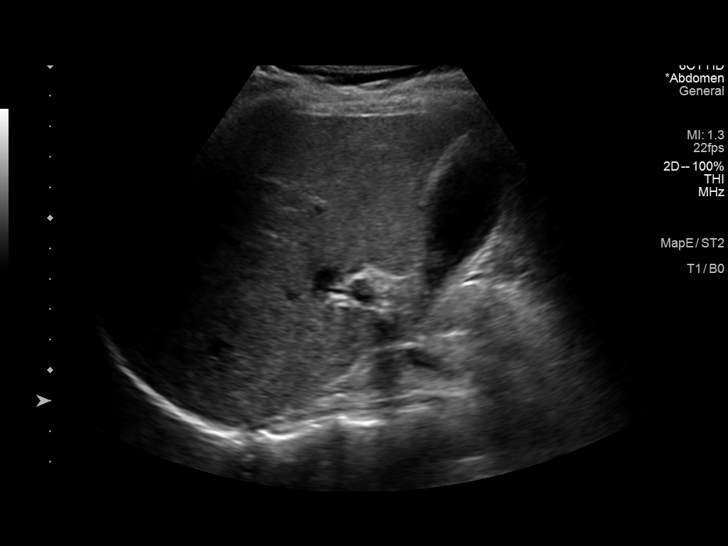
[im 27/40]
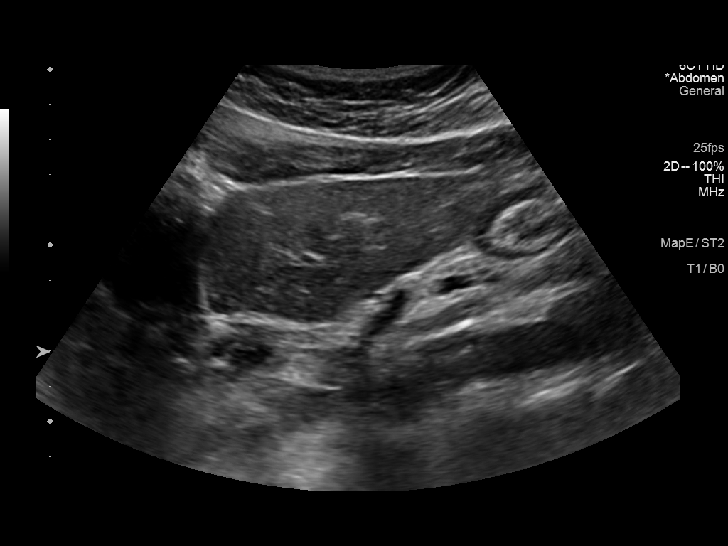
[im 30/40]
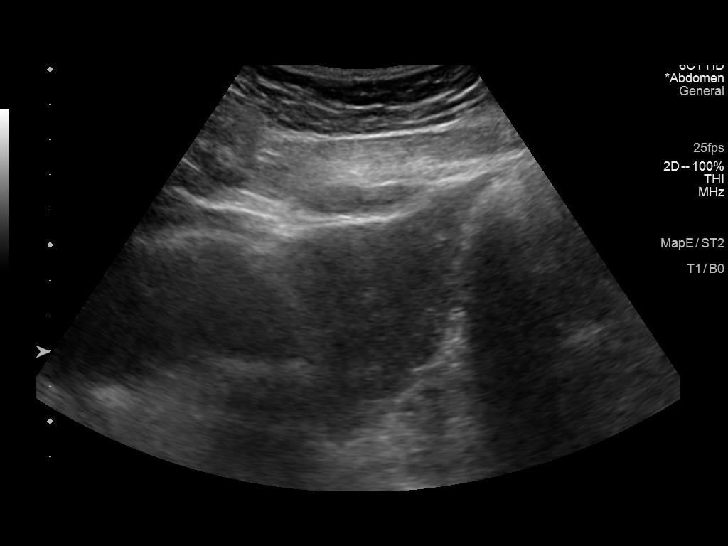
[im 33/40]
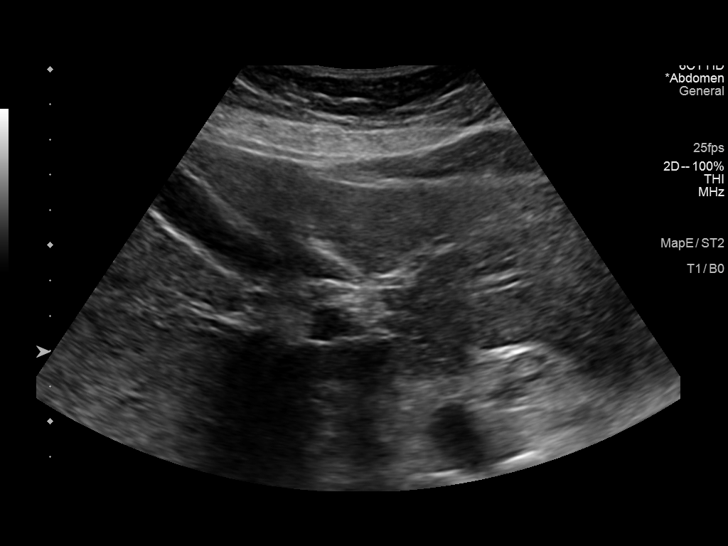
[im 36/40]
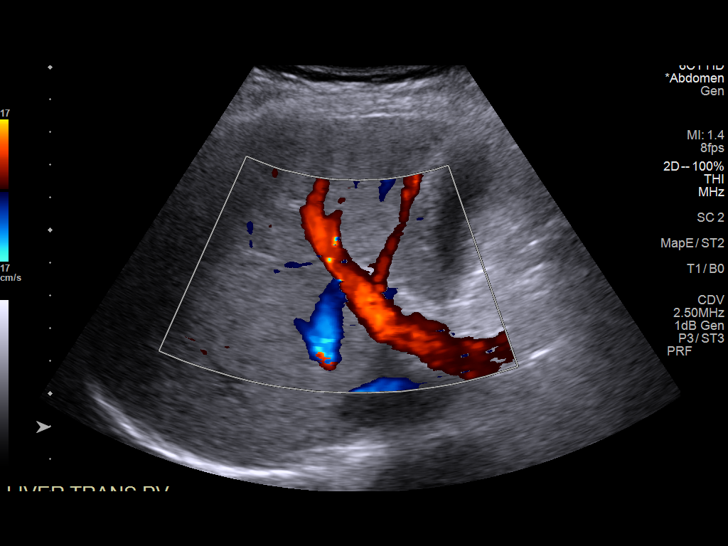
[im 40/40]
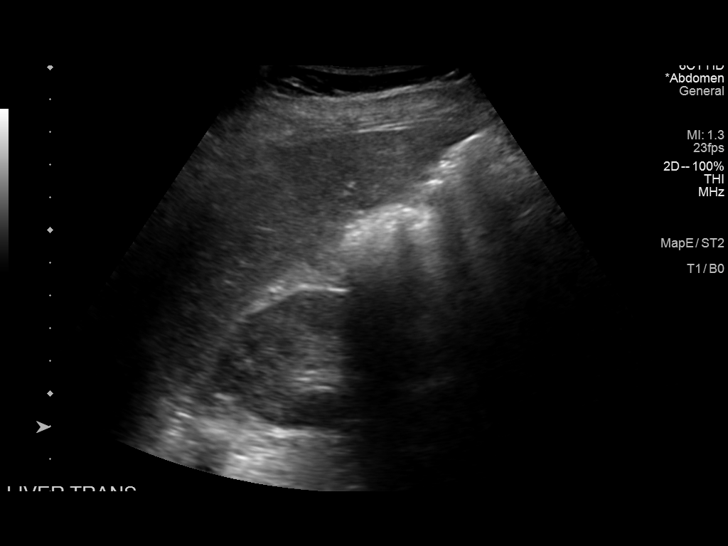

[14 of 25 positions shown; findings below may reference images not displayed]

FINDINGS: Gallbladder:

No gallstones or wall thickening visualized. No sonographic Murphy
sign noted by sonographer.

Common bile duct:

Diameter: 4 mm

Liver:

There is mild diffuse increased liver echogenicity most commonly
seen in the setting of fatty infiltration. Superimposed inflammation
or fibrosis is not excluded. Clinical correlation is recommended.
Portal vein is patent on color Doppler imaging with normal direction
of blood flow towards the liver.

Other: None.
IMPRESSION: Mild fatty liver, otherwise unremarkable right upper quadrant
ultrasound.

## 2020-11-04 ENCOUNTER — Other Ambulatory Visit: Payer: Self-pay | Admitting: Neurology

## 2020-11-04 DIAGNOSIS — G44219 Episodic tension-type headache, not intractable: Secondary | ICD-10-CM

## 2020-11-23 ENCOUNTER — Telehealth: Payer: Self-pay | Admitting: Neurology

## 2020-11-23 NOTE — Telephone Encounter (Signed)
Pt made an appt for a follow up in feb. She is out of her nortriptyline, she was wondering if she can get a refill.

## 2020-11-24 ENCOUNTER — Other Ambulatory Visit: Payer: Self-pay

## 2020-11-24 DIAGNOSIS — G44219 Episodic tension-type headache, not intractable: Secondary | ICD-10-CM

## 2020-11-24 MED ORDER — NORTRIPTYLINE HCL 50 MG PO CAPS: 50.0000 mg | ORAL_CAPSULE | Freq: Every day | ORAL | 0 refills | Status: DC

## 2020-11-24 NOTE — Telephone Encounter (Signed)
Refill sent in for pt to pharmacy on file  

## 2021-02-19 ENCOUNTER — Other Ambulatory Visit: Payer: Self-pay | Admitting: Neurology

## 2021-02-19 DIAGNOSIS — G44219 Episodic tension-type headache, not intractable: Secondary | ICD-10-CM

## 2021-02-27 ENCOUNTER — Other Ambulatory Visit: Payer: Self-pay | Admitting: Neurology

## 2021-02-27 DIAGNOSIS — G44219 Episodic tension-type headache, not intractable: Secondary | ICD-10-CM

## 2021-03-01 ENCOUNTER — Other Ambulatory Visit: Payer: Self-pay | Admitting: Neurology

## 2021-03-01 DIAGNOSIS — G44219 Episodic tension-type headache, not intractable: Secondary | ICD-10-CM

## 2021-03-01 MED ORDER — NORTRIPTYLINE HCL 50 MG PO CAPS: 50.0000 mg | ORAL_CAPSULE | Freq: Every day | ORAL | 0 refills | Status: DC

## 2021-03-01 NOTE — Telephone Encounter (Signed)
Pt lost 90 day supply of nortriptyline. She stated she has no idea where it is she doesn't remember getting it she thought  she was getting her atorvastatin but pharmacy said that she got her nortriptyline, pt knows she is going to have to pay for this script out of pocket ,

## 2021-03-01 NOTE — Telephone Encounter (Signed)
Patient stated she doesn't remember picking up her nortriptyline that was sent in on 02/20/21. She is asking for another refill, and she will have to pay cash. Same pharmacy as before [phone]

## 2021-05-21 ENCOUNTER — Encounter: Payer: Self-pay | Admitting: Neurology

## 2021-05-21 ENCOUNTER — Ambulatory Visit: Payer: 59 | Admitting: Neurology

## 2021-05-21 ENCOUNTER — Other Ambulatory Visit: Payer: Self-pay

## 2021-05-21 VITALS — BP 125/74 | HR 95 | Ht 62.0 in | Wt 165.0 lb

## 2021-05-21 DIAGNOSIS — G44219 Episodic tension-type headache, not intractable: Secondary | ICD-10-CM | POA: Diagnosis not present

## 2021-05-21 DIAGNOSIS — H832X3 Labyrinthine dysfunction, bilateral: Secondary | ICD-10-CM | POA: Diagnosis not present

## 2021-05-21 MED ORDER — NORTRIPTYLINE HCL 75 MG PO CAPS: 75.0000 mg | ORAL_CAPSULE | Freq: Every day | ORAL | 3 refills | Status: DC

## 2021-05-21 NOTE — Patient Instructions (Signed)
Good to see you!  Let's try increasing nortriptyline to 75mg  capsule every night and see if this helps with headaches better. If not tolerated, please call our office and we will go back to 50mg  capsule  2. Proceed with ENT visit as scheduled  3. Follow-up in 1 year, call for any changes

## 2021-05-21 NOTE — Progress Notes (Signed)
NEUROLOGY FOLLOW UP OFFICE NOTE  Celie Girolamo [id number] 1965-09-16  HISTORY OF PRESENT ILLNESS: I had the pleasure of seeing Rhonda Harmon in follow-up in the neurology clinic on 05/21/2021.  The patient was last seen almost 2 years ago for headaches, vertigo, memory changes. Neuropsychological testing in 05/2019 was within normal limits. Performance across majority of other cognitive tests was consistently in the average to exceptionally high normative ranges. She had good response to nortriptyline however recently she has had headaches more often than she would like, with nagging headaches occurring 2-3 times a week. She has not had the intense headaches. She is on nortriptyline 50mg  qhs without side effects. She is sleeping overall well but has issues with enlarged tonsils which impact her balance and head when she is congested. She saw ENT recently and could not tolerate calorics testing. She denies any dizziness, focal numbness/tingling/weakness, no falls. She has had vision changes with a change in her prescription occurring every 3-6 months.  History on Initial Assessment 10/20/2015: This is a pleasant 56 yo [name] with a history of headaches and vertigo. Records from her previous neurologist in [place] were reviewed. She has been having dizziness since at least 2009, described as a constant unsteady sensation daily where she feels like she is getting off a cruise ship. No associated hearing loss or tinnitus. MRI brain with and without contrast done 2009 in [place] reported as normal. She had seen vestibular therapy in [place] and was diagnosed with bilateral peripheral vestibular dysfunction. She had significant improvement of symptoms with vestibular therapy. She started having occipital neuralgia in 2013 and was started on Nortriptyline. She reports this helps with both the headaches and dizziness. She states the occipital neuralgia is better, she has pressure-like headaches over the bilateral  temporal regions occurring around once a week, no associated nausea, vomiting, photo/phonophobia, visual obscurations. She occasionally wakes up with a headache. She takes prn Excedrin with good effect. She is taking nortriptyline 50mg  qhs with no side effects, sleep is good. She denies any diplopia, dysarthria/dysphagia, focal numbness/tingling/weakness. She has some neck pain. No bowel/bladder dysfunction. Memory is good, but she sometimes has a hard time focusing due to her balance issues. She fell 6 months ago while walking her dog. She denies any history of head injuries. Before she left [place], she reported worsening headaches and dizziness and had an MRI with [facility] done 5/79/17 without contrast which I personally reviewed, there was an 8 x 74mm increased CSF signal with asymmetric enlargement of the right sylvian fissure superiorly which may represent an arachnoid cyst. There is a 49mm hyperintensity in the right parietal white matter. No acute changes.   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 Stable minimal chronic microvascular changes.   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 at home.  I chose diazepam; she has significant anxiety and stress related to her husband's recent cancer diagnosis.  She will also return for EFT.   Episodic tension-type headache, not intractable  10/20/2015   Hyperlipidemia    Seasonal allergies    Vestibular disequilibrium  10/20/2015    MEDICATIONS: Current Outpatient Medications on File Prior to Visit  Medication Sig Dispense Refill   atorvastatin (LIPITOR) 20 MG tablet Take 20 mg by mouth daily.     b complex vitamins tablet Take 1 tablet by mouth daily.     cetirizine (ZYRTEC) 10 MG tablet Take 10 mg by mouth daily.     diazepam (VALIUM) 2 MG tablet Take by mouth.     esomeprazole (NEXIUM) 20 MG capsule Take 1 capsule (20 mg total) by mouth daily at 12 noon. (Patient not taking: Reported on 08/09/2019) 30 capsule 2   Multiple Vitamin (MULTIVITAMIN) tablet Take 1 tablet by mouth daily.     nortriptyline (PAMELOR) 50 MG capsule Take 1 capsule (50 mg total) by mouth at bedtime. 90 capsule 0   VITAMIN D PO Take by mouth daily. With K added     Current Facility-Administered Medications on File Prior to Visit  Medication Dose Route Frequency Provider Last Rate Last Admin   0.9 %  sodium chloride infusion  500 mL Intravenous Once Nandigam, Venia Minks, MD        ALLERGIES: Allergies  Allergen Reactions   Other     Silk sutures    Penicillins Rash    FAMILY HISTORY: Family History  Problem Relation Age of Onset   Crohn's disease Sister    Crohn's disease Sister    Colon cancer Neg Hx    Colon polyps Neg Hx    Esophageal cancer Neg Hx    Rectal cancer Neg Hx    Stomach cancer Neg Hx    Dementia Neg Hx     SOCIAL HISTORY: Social History   Socioeconomic History   Marital status: Married    Spouse name: Not on file   Number of children: Not on file   Years of education: 12   Highest education level: High school graduate  Occupational History   Not on file  Tobacco Use   Smoking status: Former    Types: Cigarettes    Quit date: 10/19/2000    Years since quitting: 20.6   Smokeless tobacco: Never  Vaping Use   Vaping Use: Never used  Substance and Sexual Activity   Alcohol use: Yes    Comment: occ    Drug use: No   Sexual activity: Not on file  Other Topics Concern   Not on file   Social History Narrative   Right handed      Completed HS      Lives in two story home   Social Determinants of Health   Financial Resource Strain: Not on file  Food Insecurity: Not on file  Transportation Needs: Not on file  Physical Activity: Not on file  Stress: Not on file  Social Connections: Not on file  Intimate Partner Violence: Not on file     PHYSICAL EXAM: Vitals:   05/21/21 1523  BP: 125/74  Pulse: 95  SpO2: 98%   General: No acute distress Head:  Normocephalic/atraumatic Skin/Extremities: No rash, no edema Neurological Exam: alert and awake. No aphasia or dysarthria. Fund of knowledge is appropriate.  Attention and concentration are normal.   Cranial nerves: Pupils equal, round. Extraocular movements intact with no nystagmus. Visual fields full.  No facial asymmetry.  Motor: Bulk and tone normal, muscle strength 5/5 throughout with no pronator drift.   Finger to nose testing intact.  Gait narrow-based and  steady, mild difficulty with tandem walk but able. Romberg negative.   IMPRESSION: This is a pleasant 56 yo [name] with a history of occipital neuralgia and bilateral peripheral vestibular dysfunction with chronic sensation of dysequilibrium. Dizziness has not been bothersome recently. She did not tolerate calorics testing with ENT and will be seeing them for enlarged tonsils. She is reporting more nagging headaches, increase nortriptyline to 75mg  qhs. MRI brain in 2018 showed stable 8x40mm arachnoid cyst, exam today normal, no indication to repeat imaging at this time. Follow-up in 1 year, she knows to call for any changes.   Thank you for allowing me to participate in her care.  Please do not hesitate to call for any questions or concerns.    Ellouise Newer, M.D.   CC: Dr. Sabra Heck

## 2021-05-28 ENCOUNTER — Other Ambulatory Visit: Payer: Self-pay | Admitting: Neurology

## 2021-05-28 DIAGNOSIS — G44219 Episodic tension-type headache, not intractable: Secondary | ICD-10-CM

## 2021-12-10 ENCOUNTER — Ambulatory Visit (INDEPENDENT_AMBULATORY_CARE_PROVIDER_SITE_OTHER): Payer: 59

## 2021-12-10 ENCOUNTER — Ambulatory Visit (HOSPITAL_COMMUNITY)
Admission: EM | Admit: 2021-12-10 | Discharge: 2021-12-10 | Disposition: A | Discharge status: Home / Self Care | Payer: 59 | Attending: Family Medicine | Admitting: Family Medicine

## 2021-12-10 ENCOUNTER — Encounter (HOSPITAL_COMMUNITY): Payer: Self-pay

## 2021-12-10 DIAGNOSIS — S62647A Nondisplaced fracture of proximal phalanx of left little finger, initial encounter for closed fracture: Secondary | ICD-10-CM

## 2021-12-10 DIAGNOSIS — W19XXXA Unspecified fall, initial encounter: Secondary | ICD-10-CM

## 2021-12-10 MED ORDER — IBUPROFEN 800 MG PO TABS: 800.0000 mg | ORAL_TABLET | Freq: Three times a day (TID) | ORAL | 0 refills | Status: AC | PRN

## 2021-12-10 MED ORDER — IBUPROFEN 800 MG PO TABS: 800.0000 mg | ORAL_TABLET | Freq: Three times a day (TID) | ORAL | 0 refills | Status: DC | PRN

## 2021-12-10 MED ORDER — KETOROLAC TROMETHAMINE 30 MG/ML IJ SOLN
INTRAMUSCULAR | Status: AC
  Filled 2021-12-10: qty 1

## 2021-12-10 MED ORDER — KETOROLAC TROMETHAMINE 30 MG/ML IJ SOLN: 30.0000 mg | Freq: Once | INTRAMUSCULAR | Status: DC

## 2021-12-10 NOTE — Discharge Instructions (Addendum)
There is a fracture of the first part of your little finger of the left hand.  You have been given a shot of Toradol 30 mg today.  Take ibuprofen 800 mg--1 tab every 8 hours as needed for pain.

## 2021-12-10 NOTE — ED Triage Notes (Signed)
Patient tripped and landed on the left hand. Having pain, swelling and reduced range of motion in the left pinky. Onset today at 9 am.   Patient has history of nerve and tendon surgery in the left hand years ago,
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
## 2022-04-21 ENCOUNTER — Other Ambulatory Visit: Payer: Self-pay | Admitting: Neurology

## 2022-05-21 ENCOUNTER — Encounter: Payer: Self-pay | Admitting: Neurology

## 2022-05-21 ENCOUNTER — Ambulatory Visit (INDEPENDENT_AMBULATORY_CARE_PROVIDER_SITE_OTHER): Payer: 59 | Admitting: Neurology

## 2022-05-21 VITALS — BP 142/91 | HR 100 | Ht 62.0 in | Wt 156.4 lb

## 2022-05-21 DIAGNOSIS — G44219 Episodic tension-type headache, not intractable: Secondary | ICD-10-CM | POA: Diagnosis not present

## 2022-05-21 MED ORDER — NORTRIPTYLINE HCL 75 MG PO CAPS: 75.0000 mg | ORAL_CAPSULE | Freq: Every day | ORAL | 3 refills | Status: DC

## 2022-05-21 NOTE — Progress Notes (Signed)
NEUROLOGY FOLLOW UP OFFICE NOTE  Rhonda Harmon [id number] 057/22/1967  HISTORY OF PRESENT ILLNESS: I had the pleasure of seeing Rhonda Harmon in follow-up in the neurology clinic on 05/21/2022.  The patient was last seen a year ago for headaches, vertigo. Nortriptyline dose was increased to '75mg'$  qhs on last visit, she reports that headaches are doing good on this dose, they are not that often. The dizziness is not bothersome for her as well. There have been big changes in her life recently so she feels unbalanced, her husband passed away in 01/23/2023. Sleep has been affected. She fell and fractured her left 5th digit, she continues to do PT.   History on Initial Assessment 10/20/2015: This is a pleasant 57 yo [name] with a history of headaches and vertigo. Records from her previous neurologist in [place] were reviewed. She has been having dizziness since at least 2009, described as a constant unsteady sensation daily where she feels like she is getting off a cruise ship. No associated hearing loss or tinnitus. MRI brain with and without contrast done 2009 in [place] reported as normal. She had seen vestibular therapy in [place] and was diagnosed with bilateral peripheral vestibular dysfunction. She had significant improvement of symptoms with vestibular therapy. She started having occipital neuralgia in 2013 and was started on Nortriptyline. She reports this helps with both the headaches and dizziness. She states the occipital neuralgia is better, she has pressure-like headaches over the bilateral temporal regions occurring around once a week, no associated nausea, vomiting, photo/phonophobia, visual obscurations. She occasionally wakes up with a headache. She takes prn Excedrin with good effect. She is taking nortriptyline '50mg'$  qhs with no side effects, sleep is good. She denies any diplopia, dysarthria/dysphagia, focal numbness/tingling/weakness. She has some neck pain. No bowel/bladder dysfunction. Memory is  good, but she sometimes has a hard time focusing due to her balance issues. She fell 6 months ago while walking her dog. She denies any history of head injuries. Before she left [place], she reported worsening headaches and dizziness and had an MRI with [facility] done 5/79/17 without contrast which I personally reviewed, there was an 8 x 52m increased CSF signal with asymmetric enlargement of the right sylvian fissure superiorly which may represent an arachnoid cyst. There is a 350mhyperintensity in the right parietal white matter. No acute changes.   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 Stable minimal chronic microvascular changes.   Prior Preventatives: Topiramate, Gabapentin, nortriptyline  PAST MEDICAL HISTORY: Past Medical History:  Diagnosis Date   Arachnoid cyst 02/05/2016   Asthma, mild intermittent 09/14/2007   Overview:  IMO Update   Cervico-occipital neuralgia 05/06/2011   [facility]  [facility]  Last Assessment & Plan:  At this point she is interested in having something for a muscle relaxant to use p.r.n. at home.  I chose diazepam; she has significant anxiety and stress related to her husband's recent cancer diagnosis.  She will also return for EFT.   Episodic tension-type headache, not intractable 10/20/2015   Hyperlipidemia    Seasonal allergies    Vestibular disequilibrium 10/20/2015    MEDICATIONS: Current Outpatient Medications on File Prior to Visit  Medication Sig Dispense Refill   atorvastatin (LIPITOR) 20 MG tablet Take 20 mg by mouth daily.     atorvastatin (LIPITOR) 20 MG tablet  Take 1 tablet by mouth daily.     cetirizine (ZYRTEC) 10 MG tablet Take 10 mg by mouth daily.     ibuprofen (ADVIL) 800 MG tablet Take 1 tablet (800 mg total) by mouth every 8 (eight) hours as needed  (pain). 21 tablet 0   montelukast (SINGULAIR) 10 MG tablet Take 10 mg by mouth daily.     Multiple Vitamin (MULTIVITAMIN) tablet Take 1 tablet by mouth daily.     nortriptyline (PAMELOR) 75 MG capsule TAKE 1 CAPSULE BY MOUTH AT BEDTIME. 30 capsule 0   esomeprazole (NEXIUM) 20 MG capsule Take 1 capsule (20 mg total) by mouth daily at 12 noon. (Patient not taking: Reported on 05/21/2022) 30 capsule 2   No current facility-administered medications on file prior to visit.    ALLERGIES: Allergies  Allergen Reactions   Other     Silk sutures    Nickel Rash   Penicillins Rash    FAMILY HISTORY: Family History  Problem Relation Age of Onset   Crohn's disease Sister    Crohn's disease Sister    Colon cancer Neg Hx    Colon polyps Neg Hx    Esophageal cancer Neg Hx    Rectal cancer Neg Hx    Stomach cancer Neg Hx    Dementia Neg Hx     SOCIAL HISTORY: Social History   Socioeconomic History   Marital status: Married    Spouse name: Not on file   Number of children: Not on file   Years of education: 12   Highest education level: High school graduate  Occupational History   Not on file  Tobacco Use   Smoking status: Former    Types: Cigarettes    Quit date: 10/19/2000    Years since quitting: 21.6   Smokeless tobacco: Never  Vaping Use   Vaping Use: Never used  Substance and Sexual Activity   Alcohol use: Not Currently    Comment: occ    Drug use: No   Sexual activity: Not on file  Other Topics Concern   Not on file  Social History Narrative   Right handed      Completed HS      Lives in two story home   Social Determinants of Health   Financial Resource Strain: Not on file  Food Insecurity: Not on file  Transportation Needs: Not on file  Physical Activity: Not on file  Stress: Not on file  Social Connections: Not on file  Intimate Partner Violence: Not on file     PHYSICAL EXAM: Vitals:   05/21/22 0825  BP: (!) 142/91  Pulse: 100  SpO2: 99%    General: No acute distress Head:  Normocephalic/atraumatic Skin/Extremities: No rash, no edema Neurological Exam: alert and awake. No aphasia or dysarthria. Fund of knowledge is appropriate.  Attention and concentration are normal.   Cranial nerves: Pupils equal, round. Extraocular movements intact with no nystagmus. Visual fields full.  No facial asymmetry.  Motor: Bulk and tone normal, muscle strength 5/5 throughout with no pronator drift.   Finger to nose testing intact.  Gait narrow-based and steady, difficulty with tandem walk. Romberg negative.   IMPRESSION: This is a pleasant 57 yo [name] with a history of occipital neuralgia and bilateral peripheral vestibular dysfunction with chronic sensation of dysequilibrium. Dizziness has not been bothersome lately. The headaches are under control on nortriptyline '75mg'$  qhs, refills sent. MRI brain in 2018 showed stable 8x33m arachnoid cyst, exam today normal, no indication to  repeat imaging at this time. Follow-up in 1 year, call for any changes.    Thank you for allowing me to participate in her care.  Please do not hesitate to call for any questions or concerns.    Ellouise Newer, M.D.   CC: Dr. Sabra Heck

## 2022-05-21 NOTE — Patient Instructions (Signed)
Good to see you. Continue nortriptyline '75mg'$  every night. Wishing you all the best. See you in a year, call for any changes.

## 2023-05-05 ENCOUNTER — Ambulatory Visit: Payer: 59 | Admitting: Neurology

## 2023-05-05 ENCOUNTER — Encounter: Payer: Self-pay | Admitting: Neurology

## 2023-05-05 VITALS — BP 138/86 | HR 105 | Ht 62.0 in | Wt 156.6 lb

## 2023-05-05 DIAGNOSIS — H832X3 Labyrinthine dysfunction, bilateral: Secondary | ICD-10-CM | POA: Diagnosis not present

## 2023-05-05 DIAGNOSIS — G44219 Episodic tension-type headache, not intractable: Secondary | ICD-10-CM

## 2023-05-05 MED ORDER — NORTRIPTYLINE HCL 75 MG PO CAPS: 75.0000 mg | ORAL_CAPSULE | Freq: Every day | ORAL | 4 refills | Status: AC

## 2023-05-05 NOTE — Progress Notes (Deleted)
 [facility] Clinic Note - Initial Visit   Date: 05/05/2023   Rhonda Harmon MRN: [id number] DOB: 10-25-1965   Dear Dr Annabell Key, Elinda Guard, MD:  Thank you for your kind referral of Rhonda Harmon for consultation of ***. Although her history is well known to you, please allow us  to reiterate it for the purpose of our medical record. The patient was accompanied to the clinic by *** who also provides collateral information.     Rhonda Harmon is a 58 y.o. ***-handed female with *** presenting for evaluation of ***.   IMPRESSION/PLAN: ****  Return to clinic in ***  ------------------------------------------------------------- History of present illness: ***  Out-side paper records, electronic medical record, and images have been reviewed where available and summarized as: *** No results found for: "HGBA1C" Lab Results  Component Value Date   VITAMINB12 833 01/12/2019   Lab Results  Component Value Date   TSH 1.29 01/12/2019   No results found for: "ESRSEDRATE", "POCTSEDRATE"  Past Medical History:  Diagnosis Date   Arachnoid cyst 02/05/2016   Asthma, mild intermittent 09/14/2007   Overview:  IMO Update   Cervico-occipital neuralgia 05/06/2011   Overview:  Pt sees [facility]  Last Assessment & Plan:  At this point she is interested in having something for a muscle relaxant to use p.r.n. at home.  I chose diazepam; she has significant anxiety and stress related to her husband's recent cancer diagnosis.  She will also return for EFT.   Episodic tension-type headache, not intractable 10/20/2015   Hyperlipidemia    Seasonal allergies    Vestibular disequilibrium 10/20/2015    Past Surgical History:  Procedure Laterality Date   EXPLORATORY LAPAROTOMY     HAND SURGERY     pinky surgery Left    TMJ ARTHROPLASTY       Medications:  Outpatient Encounter Medications as of 05/05/2023  Medication Sig   atorvastatin (LIPITOR) 20  MG tablet Take 20 mg by mouth daily.   atorvastatin (LIPITOR) 20 MG tablet Take 1 tablet by mouth daily.   cetirizine (ZYRTEC) 10 MG tablet Take 10 mg by mouth daily.   esomeprazole  (NEXIUM ) 20 MG capsule Take 1 capsule (20 mg total) by mouth daily at 12 noon. (Patient not taking: Reported on 05/21/2022)   ibuprofen  (ADVIL ) 800 MG tablet Take 1 tablet (800 mg total) by mouth every 8 (eight) hours as needed (pain).   montelukast (SINGULAIR) 10 MG tablet Take 10 mg by mouth daily.   Multiple Vitamin (MULTIVITAMIN) tablet Take 1 tablet by mouth daily.   nortriptyline  (PAMELOR ) 75 MG capsule Take 1 capsule (75 mg total) by mouth at bedtime.   No facility-administered encounter medications on file as of 05/05/2023.    Allergies:  Allergies  Allergen Reactions   Other     Silk sutures    Nickel Rash   Penicillins Rash    Family History: Family History  Problem Relation Age of Onset   Crohn's disease Sister    Crohn's disease Sister    Colon cancer Neg Hx    Colon polyps Neg Hx    Esophageal cancer Neg Hx    Rectal cancer Neg Hx    Stomach cancer Neg Hx    Dementia Neg Hx     Social History: Social History   Tobacco Use   Smoking status: Former    Current packs/day: 0.00    Types: Cigarettes    Quit date: 10/19/2000    Years since  quitting: 22.5   Smokeless tobacco: Never  Vaping Use   Vaping status: Never Used  Substance Use Topics   Alcohol use: Not Currently    Comment: occ    Drug use: No   Social History   Social History Narrative   Right handed      Completed HS      Lives in two story home    Vital Signs:  LMP 04/26/2015    General Medical Exam:  *** General:  Well appearing, comfortable.   Eyes/ENT: see cranial nerve examination.   Neck:   No carotid bruits. Respiratory:  Clear to auscultation, good air entry bilaterally.   Cardiac:  Regular rate and rhythm, no murmur.   Extremities:  No deformities, edema, or skin discoloration.  Skin:  No rashes  or lesions.  Neurological Exam: MENTAL STATUS including orientation to time, place, person, recent and remote memory, attention span and concentration, language, and fund of knowledge is ***normal.  Speech is not dysarthric.  CRANIAL NERVES: II:  No visual field defects.  ***   III-IV-VI: Pupils equal round and reactive to light.  Normal conjugate, extra-ocular eye movements in all directions of gaze.  No nystagmus.  No ptosis***.   V:  Normal facial sensation.    VII:  Normal facial symmetry and movements.   VIII:  Normal hearing and vestibular function.   IX-X:  Normal palatal movement.   XI:  Normal shoulder shrug and head rotation.   XII:  Normal tongue strength and range of motion, no deviation or fasciculation.  MOTOR:  No atrophy, fasciculations or abnormal movements.  No pronator drift.   Upper Extremity:  Right  Left  Deltoid  5/5   5/5   Biceps  5/5   5/5   Triceps  5/5   5/5   Infraspinatus 5/5  5/5  Medial pectoralis 5/5  5/5  Wrist extensors  5/5   5/5   Wrist flexors  5/5   5/5   Finger extensors  5/5   5/5   Finger flexors  5/5   5/5   Dorsal interossei  5/5   5/5   Abductor pollicis  5/5   5/5   Tone (Ashworth scale)  0  0   Lower Extremity:  Right  Left  Hip flexors  5/5   5/5   Hip extensors  5/5   5/5   Adductor 5/5  5/5  Abductor 5/5  5/5  Knee flexors  5/5   5/5   Knee extensors  5/5   5/5   Dorsiflexors  5/5   5/5   Plantarflexors  5/5   5/5   Toe extensors  5/5   5/5   Toe flexors  5/5   5/5   Tone (Ashworth scale)  0  0   MSRs:                                           Right        Left brachioradialis 2+  2+  biceps 2+  2+  triceps 2+  2+  patellar 2+  2+  ankle jerk 2+  2+  Hoffman no  no  plantar response down  down   SENSORY:  Normal and symmetric perception of light touch, pinprick, vibration, and proprioception.  Romberg's sign absent.   COORDINATION/GAIT: Normal finger-to- nose-finger***.  Intact rapid alternating  movements  bilaterally.  Able to rise from a chair without using arms.  Gait narrow based and stable. Tandem and stressed gait intact.    ***   Thank you for allowing me to participate in patient's care.  If I can answer any additional questions, I would be pleased to do so.    Sincerely,    Frank Pilger K. Lydia Sams, DO

## 2023-05-05 NOTE — Patient Instructions (Signed)
 Always good to see you. Continue Nortriptyline  75mg  every night. Follow-up in 1 year, call for any changes.

## 2023-05-05 NOTE — Progress Notes (Signed)
 NEUROLOGY FOLLOW UP OFFICE NOTE  Rhonda Harmon [id number] [birth year]  HISTORY OF PRESENT ILLNESS: I had the pleasure of seeing Rhonda Harmon in follow-up in the neurology clinic on 05/05/2023.  The patient was last seen a year ago for headaches, vertigo. She is on Nortriptyline  75mg  at bedtime with good response. She rarely has headaches, around every 3-4 months. She still has some vertigo, she has to focus on it more. She has had a hectic year. She has had some vision changes. No focal numbness/tingling/weakness, neck/back pain. No falls. She gets 5-8 hours of sleep. She lives alone.    History on Initial Assessment 10/20/2015: This is a pleasant 58 yo RH 58 yo [name] with a history of headaches and vertigo. Records from her previous neurologist in [place]  were reviewed. She has been having dizziness since at least 2009, described as a constant unsteady sensation daily where she feels like she is getting off a cruise ship. No associated hearing loss or tinnitus. MRI brain with and without contrast done 2009 in [place]  reported as normal. She had seen vestibular therapy in [place]  and was diagnosed with bilateral peripheral vestibular dysfunction. She had significant improvement of symptoms with vestibular therapy. She started having occipital neuralgia in 2013 and was started on Nortriptyline . She reports this helps with both the headaches and dizziness. She states the occipital neuralgia is better, she has pressure-like headaches over the bilateral temporal regions occurring around once a week, no associated nausea, vomiting, photo/phonophobia, visual obscurations. She occasionally wakes up with a headache. She takes prn Excedrin with good effect. She is taking nortriptyline  50mg  qhs with no side effects, sleep is good. She denies any diplopia, dysarthria/dysphagia, focal numbness/tingling/weakness. She has some neck pain. No bowel/bladder dysfunction. Memory is good, but she sometimes has a hard time focusing due  to her balance issues. She fell 6 months ago while walking her dog. She denies any history of head injuries. Before she left [place] , she reported worsening headaches and dizziness and had an MRI with [facility] done 5/79/17 without contrast which I personally reviewed, there was an 8 x 15mm increased CSF signal with asymmetric enlargement of the right sylvian fissure superiorly which may represent an arachnoid cyst. There is a 3mm hyperintensity in the right parietal white matter. No acute changes.   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 Stable minimal chronic microvascular changes.   Prior Preventatives: Topiramate , Gabapentin , nortriptyline   PAST MEDICAL HISTORY: Past Medical History:  Diagnosis Date   Arachnoid cyst 02/05/2016   Asthma, mild intermittent 09/14/2007   Overview:  IMO Update   Cervico-occipital neuralgia 05/06/2011   Overview:  Pt sees [facility]  Last Assessment & Plan:  At this point she is interested in having something for a muscle relaxant to use p.r.n. at home.  I chose diazepam; she has significant anxiety and stress related to her husband's recent cancer diagnosis.  She will also return for EFT.   Episodic tension-type headache, not intractable 10/20/2015   Hyperlipidemia    Seasonal allergies    Vestibular disequilibrium 10/20/2015    MEDICATIONS: Current Outpatient Medications on File Prior to Visit  Medication Sig Dispense Refill   atorvastatin (LIPITOR) 20 MG tablet Take 20 mg by mouth daily.     atorvastatin (LIPITOR) 20 MG tablet Take 1 tablet by mouth daily.     cetirizine (  ZYRTEC) 10 MG tablet Take 10 mg by mouth daily.     esomeprazole  (NEXIUM ) 20 MG capsule Take 1 capsule (20 mg total) by mouth daily at 12 noon. 30 capsule 2   ibuprofen  (ADVIL ) 800 MG tablet Take 1 tablet (800  mg total) by mouth every 8 (eight) hours as needed (pain). 21 tablet 0   montelukast (SINGULAIR) 10 MG tablet Take 10 mg by mouth daily.     Multiple Vitamin (MULTIVITAMIN) tablet Take 1 tablet by mouth daily.     No current facility-administered medications on file prior to visit.    ALLERGIES: Allergies  Allergen Reactions   Other     Silk sutures    Nickel Rash   Penicillins Rash    FAMILY HISTORY: Family History  Problem Relation Age of Onset   Crohn's disease Sister    Crohn's disease Sister    Colon cancer Neg Hx    Colon polyps Neg Hx    Esophageal cancer Neg Hx    Rectal cancer Neg Hx    Stomach cancer Neg Hx    Dementia Neg Hx     SOCIAL HISTORY: Social History   Socioeconomic History   Marital status: Married    Spouse name: Not on file   Number of children: Not on file   Years of education: 12   Highest education level: High school graduate  Occupational History   Not on file  Tobacco Use   Smoking status: Former    Current packs/day: 0.00    Types: Cigarettes    Quit date: 10/19/2000    Years since quitting: 22.5   Smokeless tobacco: Never  Vaping Use   Vaping status: Never Used  Substance and Sexual Activity   Alcohol use: Not Currently    Comment: occ    Drug use: No   Sexual activity: Not on file  Other Topics Concern   Not on file  Social History Narrative   Right handed      Completed HS      Lives in two story home   Social Drivers of Health   Financial Resource Strain: Low Risk  (12/29/2020)   [facility]   Overall Financial Resource Strain (CARDIA)    Difficulty of Paying Living Expenses: Not hard at all  Food Insecurity: No Food Insecurity (07/29/2022)   Received from [facility] Vital Sign    Worried About Running Out of Food in the Last Year: Never true    Ran Out of Food in the Last Year: Never true  Transportation Needs: No Transportation Needs (07/29/2022)   Received from [facility] - Transportation    Lack of Transportation (Medical): No    Lack of Transportation (Non-Medical): No  Physical Activity: Sufficiently Active (12/29/2020)   [facility]   Exercise Vital Sign    Days of Exercise per Week: 7 days    Minutes of Exercise per Session: 30 min  Stress: No Stress Concern Present (12/29/2020)   [facility]   [employer] of Occupational Health - Occupational Stress Questionnaire    Feeling of Stress : Not at all  Social Connections: Moderately Isolated (12/29/2020)   [facility]   Social Connection and Isolation Panel [NHANES]    Frequency of Communication with Friends and Family: Three times a week    Frequency of Social Gatherings with Friends and Family: Twice a week    Attends Religious Services: Never  Active Member of Clubs or Organizations: No    Attends Banker Meetings: Never    Marital Status: Married  Catering manager Violence: Not At Risk (12/29/2020)   Received from [place]   Humiliation, Afraid, Rape, and Kick questionnaire    Fear of Current or Ex-Partner: No    Emotionally Abused: No    Physically Abused: No    Sexually Abused: No     PHYSICAL EXAM: Vitals:   05/05/23 0817  BP: 138/86  Pulse: (!) 105  SpO2: 96%   General: No acute distress Head:  Normocephalic/atraumatic Skin/Extremities: No rash, no edema Neurological Exam: alert and awake. No aphasia or dysarthria. Fund of knowledge is appropriate.  Attention and concentration are normal.   Cranial nerves: Pupils equal, round. Extraocular movements intact with no nystagmus. Visual fields full.  No facial asymmetry.  Motor: Bulk and tone normal, muscle strength 5/5 throughout with no pronator drift.   Finger to nose testing intact.  Gait narrow-based and steady, able to tandem walk adequately.  Romberg negative.   IMPRESSION: This is a pleasant 58 yo [name]  with a history of occipital neuralgia and bilateral peripheral vestibular dysfunction with chronic sensation of dysequilibrium. Headaches and dizziness overall stable, she is on nortriptyline  75mg  at bedtime without side effects, refills sent. MRI brain in 2018 showed stable 8x53mm arachnoid cyst, exam today normal, no indication to repeat imaging at this time. Follow-up in 1 year, call for any changes.    Thank you for allowing me to participate in her care.  Please do not hesitate to call for any questions or concerns.    Rayfield Cairo, M.D.   CC: Dr. Annabell Key

## 2024-02-04 ENCOUNTER — Encounter: Payer: Self-pay | Admitting: Neurology

## 2024-05-04 ENCOUNTER — Ambulatory Visit: Payer: 59 | Admitting: Neurology
